# Patient Record
Sex: Female | Born: 1955 | Race: White | Hispanic: No | Marital: Married | Smoking: Former smoker
Health system: Southern US, Community
[De-identification: ages and names within clinical notes are randomized; demographics above are authoritative.]

## PROBLEM LIST (undated history)

## (undated) DIAGNOSIS — E039 Hypothyroidism, unspecified: Secondary | ICD-10-CM

## (undated) DIAGNOSIS — R112 Nausea with vomiting, unspecified: Secondary | ICD-10-CM

## (undated) DIAGNOSIS — G43909 Migraine, unspecified, not intractable, without status migrainosus: Secondary | ICD-10-CM

## (undated) DIAGNOSIS — K4021 Bilateral inguinal hernia, without obstruction or gangrene, recurrent: Secondary | ICD-10-CM

## (undated) DIAGNOSIS — K859 Acute pancreatitis without necrosis or infection, unspecified: Secondary | ICD-10-CM

## (undated) DIAGNOSIS — K219 Gastro-esophageal reflux disease without esophagitis: Secondary | ICD-10-CM

## (undated) DIAGNOSIS — E079 Disorder of thyroid, unspecified: Secondary | ICD-10-CM

## (undated) DIAGNOSIS — Z9889 Other specified postprocedural states: Secondary | ICD-10-CM

## (undated) DIAGNOSIS — E785 Hyperlipidemia, unspecified: Secondary | ICD-10-CM

## (undated) HISTORY — DX: Hyperlipidemia, unspecified: E78.5

## (undated) HISTORY — PX: BREAST BIOPSY: SHX20

---

## 1979-07-19 HISTORY — PX: TUBAL LIGATION: SHX77

## 1997-11-17 HISTORY — PX: HERNIA REPAIR: SHX51

## 2004-11-17 HISTORY — PX: BREAST SURGERY: SHX581

## 2005-11-17 HISTORY — PX: CHOLECYSTECTOMY: SHX55

## 2010-01-30 ENCOUNTER — Other Ambulatory Visit: Admission: RE | Admit: 2010-01-30 | Discharge: 2010-01-30 | Payer: Self-pay | Admitting: Family Medicine

## 2010-06-26 ENCOUNTER — Encounter: Admission: RE | Admit: 2010-06-26 | Discharge: 2010-06-26 | Payer: Self-pay | Admitting: Family Medicine

## 2012-02-26 ENCOUNTER — Other Ambulatory Visit: Payer: Self-pay | Admitting: Family Medicine

## 2012-02-26 DIAGNOSIS — Z1231 Encounter for screening mammogram for malignant neoplasm of breast: Secondary | ICD-10-CM

## 2012-03-04 ENCOUNTER — Ambulatory Visit
Admission: RE | Admit: 2012-03-04 | Discharge: 2012-03-04 | Disposition: A | Discharge status: Home / Self Care | Payer: Commercial Indemnity | Source: Ambulatory Visit | Attending: Family Medicine | Admitting: Family Medicine

## 2012-03-04 DIAGNOSIS — Z1231 Encounter for screening mammogram for malignant neoplasm of breast: Secondary | ICD-10-CM

## 2012-08-05 ENCOUNTER — Ambulatory Visit: Payer: Commercial Indemnity | Admitting: Family Medicine

## 2013-07-26 ENCOUNTER — Other Ambulatory Visit: Payer: Self-pay

## 2013-07-26 DIAGNOSIS — Z1231 Encounter for screening mammogram for malignant neoplasm of breast: Secondary | ICD-10-CM

## 2013-07-29 ENCOUNTER — Ambulatory Visit
Admission: RE | Admit: 2013-07-29 | Discharge: 2013-07-29 | Disposition: A | Discharge status: Home / Self Care | Payer: Commercial Indemnity | Source: Ambulatory Visit

## 2013-07-29 DIAGNOSIS — Z1231 Encounter for screening mammogram for malignant neoplasm of breast: Secondary | ICD-10-CM

## 2013-09-14 ENCOUNTER — Encounter (HOSPITAL_COMMUNITY): Payer: Self-pay | Admitting: Emergency Medicine

## 2013-09-14 ENCOUNTER — Inpatient Hospital Stay (HOSPITAL_COMMUNITY)
Admission: EM | Admit: 2013-09-14 | Discharge: 2013-09-18 | DRG: 440 | Disposition: A | Discharge status: Home / Self Care | Payer: Commercial Indemnity | Attending: Internal Medicine | Admitting: Internal Medicine

## 2013-09-14 ENCOUNTER — Emergency Department (HOSPITAL_COMMUNITY): Payer: Commercial Indemnity

## 2013-09-14 DIAGNOSIS — K8050 Calculus of bile duct without cholangitis or cholecystitis without obstruction: Secondary | ICD-10-CM

## 2013-09-14 DIAGNOSIS — E039 Hypothyroidism, unspecified: Secondary | ICD-10-CM | POA: Diagnosis present

## 2013-09-14 DIAGNOSIS — K859 Acute pancreatitis without necrosis or infection, unspecified: Principal | ICD-10-CM

## 2013-09-14 HISTORY — DX: Migraine, unspecified, not intractable, without status migrainosus: G43.909

## 2013-09-14 HISTORY — DX: Acute pancreatitis without necrosis or infection, unspecified: K85.90

## 2013-09-14 HISTORY — DX: Disorder of thyroid, unspecified: E07.9

## 2013-09-14 LAB — COMPREHENSIVE METABOLIC PANEL
ALT: 12 U/L (ref 0–35)
Albumin: 4.1 g/dL (ref 3.5–5.2)
BUN: 15 mg/dL (ref 6–23)
CO2: 22 mEq/L (ref 19–32)
GFR calc Af Amer: >90 mL/min (ref >90)
Glucose, Bld: 171 mg/dL — ABNORMAL HIGH (ref 70–99)
Potassium: 4.7 mEq/L (ref 3.5–5.1)
Sodium: 138 mEq/L (ref 135–145)

## 2013-09-14 LAB — URINALYSIS, ROUTINE W REFLEX MICROSCOPIC
Hgb urine dipstick: NEGATIVE
Ketones, ur: >80 mg/dL — AB
Nitrite: NEGATIVE
Protein, ur: NEGATIVE mg/dL
Specific Gravity, Urine: 1.031 — ABNORMAL HIGH (ref 1.005–1.030)
Urobilinogen, UA: 0.2 mg/dL (ref 0.0–1.0)
pH: 5 (ref 5.0–8.0)

## 2013-09-14 LAB — CBC WITH DIFFERENTIAL/PLATELET
Basophils Absolute: 0 10*3/uL (ref 0.0–0.1)
Eosinophils Absolute: 0 10*3/uL (ref 0.0–0.7)
Eosinophils Relative: 0 % (ref 0–5)
Lymphs Abs: 1 10*3/uL (ref 0.7–4.0)
MCHC: 33.9 g/dL (ref 30.0–36.0)
Monocytes Relative: 4 % (ref 3–12)
RBC: 5.15 MIL/uL — ABNORMAL HIGH (ref 3.87–5.11)
RDW: 12.7 % (ref 11.5–15.5)

## 2013-09-14 MED ORDER — ONDANSETRON HCL 4 MG/2ML IJ SOLN
4.0000 mg | Freq: Once | INTRAMUSCULAR | Status: DC
  Filled 2013-09-14: qty 2

## 2013-09-14 MED ORDER — IOHEXOL 300 MG/ML  SOLN
100.0000 mL | Freq: Once | INTRAMUSCULAR | Status: AC | PRN
  Administered 2013-09-14: 100 mL via INTRAVENOUS

## 2013-09-14 MED ORDER — ONDANSETRON 4 MG PO TBDP
8.0000 mg | ORAL_TABLET | Freq: Once | ORAL | Status: AC
  Administered 2013-09-14: 8 mg via ORAL

## 2013-09-14 MED ORDER — MORPHINE SULFATE 4 MG/ML IJ SOLN
4.0000 mg | Freq: Once | INTRAMUSCULAR | Status: AC
  Administered 2013-09-14: 4 mg via INTRAVENOUS
  Filled 2013-09-14: qty 1

## 2013-09-14 MED ORDER — SODIUM CHLORIDE 0.9 % IV BOLUS (SEPSIS): 1000.0000 mL | Freq: Once | INTRAVENOUS | Status: DC

## 2013-09-14 MED ORDER — SODIUM CHLORIDE 0.9 % IV SOLN
INTRAVENOUS | Status: DC
  Administered 2013-09-15: 01:00:00 via INTRAVENOUS

## 2013-09-14 MED ORDER — SODIUM CHLORIDE 0.9 % IV BOLUS (SEPSIS)
1000.0000 mL | Freq: Once | INTRAVENOUS | Status: AC
  Administered 2013-09-14: 1000 mL via INTRAVENOUS

## 2013-09-14 MED ORDER — SODIUM CHLORIDE 0.9 % IV SOLN: 1000.0000 mL | Freq: Once | INTRAVENOUS | Status: DC

## 2013-09-14 MED ORDER — ONDANSETRON 4 MG PO TBDP
ORAL_TABLET | ORAL | Status: AC
  Filled 2013-09-14: qty 2

## 2013-09-14 MED ORDER — ONDANSETRON HCL 4 MG/2ML IJ SOLN
4.0000 mg | Freq: Once | INTRAMUSCULAR | Status: AC
  Administered 2013-09-14: 4 mg via INTRAVENOUS

## 2013-09-14 MED ORDER — SODIUM CHLORIDE 0.9 % IV SOLN: 1000.0000 mL | INTRAVENOUS | Status: DC

## 2013-09-14 MED ORDER — IOHEXOL 300 MG/ML  SOLN
25.0000 mL | INTRAMUSCULAR | Status: AC
  Administered 2013-09-14: 25 mL via ORAL

## 2013-09-14 MED ORDER — MORPHINE SULFATE 4 MG/ML IJ SOLN: 4.0000 mg | Freq: Once | INTRAMUSCULAR | Status: DC

## 2013-09-14 NOTE — ED Notes (Signed)
Pt c/o mid sternal abd pain into chest that feels same as when had pancreatitis in past; pt vomiting at present

## 2013-09-14 NOTE — ED Provider Notes (Signed)
CSN: [id number]     Arrival date & time 09/14/13  1659 History   First MD Initiated Contact with Patient 09/14/13 2050     Chief Complaint  Patient presents with  . Abdominal Pain  . Emesis   (Consider location/radiation/quality/duration/timing/severity/associated sxs/prior Treatment) HPI Comments: 57 year old female presents with epigastric abdominal pain since this morning. She states the pain felt like a "stomach bug" that progressively got worse. It feels like the time she had pancreatitis last year. She states that there is a "stone that passed" that was determined to be the cause of her pancreatitis. She states this all happened of bursitis. She's had her gallbladder out but that was several years ago. She denies alcohol intake except for minimal non-daily intake. She denies any fevers or chills. She started vomiting while she was in triage.  Patient is a 57 y.o. female presenting with vomiting.  Emesis Associated symptoms: abdominal pain   Associated symptoms: no diarrhea     Past Medical History  Diagnosis Date  . Pancreatitis   . Thyroid disease   . Migraine    Past Surgical History  Procedure Laterality Date  . Cholecystectomy     History reviewed. No pertinent family history. History  Substance Use Topics  . Smoking status: Never Smoker   . Smokeless tobacco: Not on file  . Alcohol Use: Yes     Comment: occ   OB History   Grav Para Term Preterm Abortions TAB SAB Ect Mult Living                 Review of Systems  Constitutional: Negative for fever.  Cardiovascular: Positive for chest pain.  Gastrointestinal: Positive for nausea, vomiting and abdominal pain. Negative for diarrhea and constipation.  All other systems reviewed and are negative.    Allergies  Gabapentin  Home Medications   Current Outpatient Rx  Name  Route  Sig  Dispense  Refill  . HYDROcodone-acetaminophen (VICODIN) 5-500 MG per tablet   Oral   Take 1 tablet by mouth every 6 (six)  hours as needed for pain (PAIN).         Marland Kitchen levothyroxine (SYNTHROID, LEVOTHROID) 100 MCG tablet   Oral   Take 100 mcg by mouth daily before breakfast.          BP 136/80  Pulse 73  Temp(Src) 97.6 F (36.4 C) (Oral)  Resp 18  SpO2 100% Physical Exam  Nursing note and vitals reviewed. Constitutional: She is oriented to person, place, and time. She appears well-developed and well-nourished. No distress.  HENT:  Head: Normocephalic and atraumatic.  Right Ear: External ear normal.  Left Ear: External ear normal.  Nose: Nose normal.  Eyes: Right eye exhibits no discharge. Left eye exhibits no discharge.  Cardiovascular: Normal rate, regular rhythm and normal heart sounds.   Pulmonary/Chest: Effort normal and breath sounds normal.  Abdominal: Soft. There is tenderness in the right upper quadrant and epigastric area.  Neurological: She is alert and oriented to person, place, and time.  Skin: Skin is warm and dry.    ED Course  Procedures (including critical care time) Labs Review Labs Reviewed  CBC WITH DIFFERENTIAL - Abnormal; Notable for the following:    RBC 5.15 (*)    Neutrophils Relative % 85 (*)    Neutro Abs 8.3 (*)    Lymphocytes Relative 11 (*)    All other components within normal limits  COMPREHENSIVE METABOLIC PANEL - Abnormal; Notable for the following:  Glucose, Bld 171 (*)    All other components within normal limits  LIPASE, BLOOD - Abnormal; Notable for the following:    Lipase 2831 (*)    All other components within normal limits  URINALYSIS, ROUTINE W REFLEX MICROSCOPIC - Abnormal; Notable for the following:    Color, Urine AMBER (*)    Specific Gravity, Urine 1.031 (*)    Bilirubin Urine SMALL (*)    Ketones, ur >80 (*)    All other components within normal limits   Imaging Review Ct Abdomen Pelvis W Contrast  09/14/2013   CLINICAL DATA:  Abdominal pain and emesis. Pancreatitis  EXAM: CT ABDOMEN AND PELVIS WITH CONTRAST  TECHNIQUE:  Multidetector CT imaging of the abdomen and pelvis was performed using the standard protocol following bolus administration of intravenous contrast.  CONTRAST:  OMNIPAQUE IOHEXOL 300 MG/ML  SOLN  COMPARISON:  None.  FINDINGS: BODY WALL: Unremarkable.  LOWER CHEST: Moderate sliding-type hiatal hernia with retained oral contrast.  ABDOMEN/PELVIS:  Liver: No focal abnormality.  Biliary: Cholecystectomy. There is a 4 mm stone/calcification in the region of the confluence of the main pancreatic duct and CBD. The common bile duct is not particularly dilated, measuring at most 7 mm near the hilum.  Pancreas: Pancreatic enlargement and low-attenuation with extensive peripancreatic fluid. No formed collection or evidence of necrosis. No vascular complications seen.  Spleen: Unremarkable.  Adrenals: Unremarkable.  Kidneys and ureters: 4 cm interpolar cyst on the left. No stone or hydronephrosis.  Bladder: Unremarkable.  Reproductive: Heterogeneously enhancing uterus with multiple surface lobulations, consistent with fibroids. 1 of the largest fibroids is in the region of the left cornua, measuring 3 cm in diameter.  Bowel: Reactive thickening of the duodenum. Normal appendix.  Retroperitoneum: Pancreatitis edema as above.  Peritoneum: Small volume free fluid in the pelvis.  Vascular: No acute abnormality.  OSSEOUS: No acute abnormalities.  IMPRESSION: 1. Acute interstitial pancreatitis without necrosis or organized collection. Suspect choledocholithiasis with a 4mm stone just proximal to the major papilla. 2. Cholecystectomy. 3. Moderate sliding hiatal hernia. 4. Fibroid uterus.   Electronically Signed   By: Tiburcio Pea M.D.   On: 09/14/2013 23:23    EKG Interpretation     Ventricular Rate:  109 PR Interval:  170 QRS Duration: 78 QT Interval:  354 QTC Calculation: 476 R Axis:   26 Text Interpretation:  Sinus tachycardia Low voltage QRS Cannot rule out Anterior infarct , age undetermined Abnormal ECG No  old tracing to compare            MDM   1. Pancreatitis   2. Choledocholithiasis    Patient is HDS in ED. Source appears to be choledocholithiasis. Will have hospitalist admit with inpatient GI consult. Will keep NPO and fluid hydrate and control pain with morphine.    Audree Camel, MD 09/14/13 [phone]

## 2013-09-14 NOTE — ED Notes (Signed)
Patient transported to CT 

## 2013-09-14 NOTE — ED Notes (Signed)
Vital signs stable. 

## 2013-09-14 NOTE — ED Notes (Signed)
Pt back from CT, monitor reapplied.

## 2013-09-14 NOTE — ED Notes (Signed)
Denies nausea  

## 2013-09-14 NOTE — ED Notes (Signed)
Pt c/o abd pain with hx of pancreatitis, according to her the pain is feels the same as when she had pancreatitis in the past.  Pain began today, and she has also had nausea since 1630 , and threw up 3 times.  She denies any blood in her vomit or any coffee ground looking material.  denies diarrhea, sob, chest pain, dizziness, changes in vision.

## 2013-09-15 ENCOUNTER — Encounter (HOSPITAL_COMMUNITY): Payer: Self-pay | Admitting: Internal Medicine

## 2013-09-15 DIAGNOSIS — E039 Hypothyroidism, unspecified: Secondary | ICD-10-CM | POA: Diagnosis present

## 2013-09-15 DIAGNOSIS — K859 Acute pancreatitis without necrosis or infection, unspecified: Principal | ICD-10-CM

## 2013-09-15 DIAGNOSIS — K8050 Calculus of bile duct without cholangitis or cholecystitis without obstruction: Secondary | ICD-10-CM

## 2013-09-15 LAB — BASIC METABOLIC PANEL
CO2: 24 mEq/L (ref 19–32)
Calcium: 8.9 mg/dL (ref 8.4–10.5)
Creatinine, Ser: 0.67 mg/dL (ref 0.50–1.10)
GFR calc non Af Amer: >90 mL/min (ref >90)
Glucose, Bld: 130 mg/dL — ABNORMAL HIGH (ref 70–99)
Potassium: 4.4 mEq/L (ref 3.5–5.1)
Sodium: 137 mEq/L (ref 135–145)

## 2013-09-15 LAB — CBC
HCT: 40.3 % (ref 36.0–46.0)
MCH: 28.5 pg (ref 26.0–34.0)
MCV: 84.5 fL (ref 78.0–100.0)
Platelets: 276 10*3/uL (ref 150–400)
RBC: 4.77 MIL/uL (ref 3.87–5.11)
RDW: 12.8 % (ref 11.5–15.5)

## 2013-09-15 LAB — LIPASE, BLOOD: Lipase: 2312 U/L — ABNORMAL HIGH (ref 11–59)

## 2013-09-15 LAB — HEPATIC FUNCTION PANEL
AST: 15 U/L (ref 0–37)
Bilirubin, Direct: 0.1 mg/dL (ref 0.0–0.3)
Total Bilirubin: 0.6 mg/dL (ref 0.3–1.2)

## 2013-09-15 LAB — GLUCOSE, CAPILLARY
Glucose-Capillary: 101 mg/dL — ABNORMAL HIGH (ref 70–99)
Glucose-Capillary: 116 mg/dL — ABNORMAL HIGH (ref 70–99)
Glucose-Capillary: 44 mg/dL — CL (ref 70–99)
Glucose-Capillary: 87 mg/dL (ref 70–99)

## 2013-09-15 MED ORDER — MORPHINE SULFATE 2 MG/ML IJ SOLN
2.0000 mg | INTRAMUSCULAR | Status: AC
  Administered 2013-09-15: 05:00:00 2 mg via INTRAVENOUS
  Filled 2013-09-15: qty 1

## 2013-09-15 MED ORDER — ONDANSETRON HCL 4 MG PO TABS: 4.0000 mg | ORAL_TABLET | Freq: Four times a day (QID) | ORAL | Status: DC | PRN

## 2013-09-15 MED ORDER — SODIUM CHLORIDE 0.9 % IV SOLN
INTRAVENOUS | Status: DC
  Administered 2013-09-15: 500 mL via INTRAVENOUS
  Administered 2013-09-15 – 2013-09-16 (×6): via INTRAVENOUS

## 2013-09-15 MED ORDER — CHLORHEXIDINE GLUCONATE 0.12 % MT SOLN
15.0000 mL | Freq: Two times a day (BID) | OROMUCOSAL | Status: DC
  Administered 2013-09-15 – 2013-09-18 (×7): 15 mL via OROMUCOSAL
  Filled 2013-09-15 (×9): qty 15

## 2013-09-15 MED ORDER — MORPHINE SULFATE 2 MG/ML IJ SOLN
2.0000 mg | INTRAMUSCULAR | Status: DC | PRN
  Administered 2013-09-15 (×4): 2 mg via INTRAVENOUS
  Filled 2013-09-15 (×4): qty 1

## 2013-09-15 MED ORDER — ONDANSETRON HCL 4 MG/2ML IJ SOLN
4.0000 mg | Freq: Four times a day (QID) | INTRAMUSCULAR | Status: DC | PRN
  Administered 2013-09-15 – 2013-09-16 (×4): 4 mg via INTRAVENOUS
  Filled 2013-09-15 (×4): qty 2

## 2013-09-15 MED ORDER — SODIUM CHLORIDE 0.9 % IV SOLN: INTRAVENOUS | Status: DC

## 2013-09-15 MED ORDER — DEXTROSE 50 % IV SOLN
INTRAVENOUS | Status: AC
  Filled 2013-09-15: qty 50

## 2013-09-15 MED ORDER — LEVOTHYROXINE SODIUM 100 MCG IV SOLR
50.0000 ug | Freq: Every day | INTRAVENOUS | Status: DC
  Administered 2013-09-15 – 2013-09-18 (×4): 50 ug via INTRAVENOUS
  Filled 2013-09-15 (×6): qty 5

## 2013-09-15 MED ORDER — ACETAMINOPHEN 325 MG PO TABS: 650.0000 mg | ORAL_TABLET | Freq: Four times a day (QID) | ORAL | Status: DC | PRN

## 2013-09-15 MED ORDER — ACETAMINOPHEN 650 MG RE SUPP: 650.0000 mg | Freq: Four times a day (QID) | RECTAL | Status: DC | PRN

## 2013-09-15 MED ORDER — BIOTENE DRY MOUTH MT LIQD
15.0000 mL | Freq: Two times a day (BID) | OROMUCOSAL | Status: DC
  Administered 2013-09-15 – 2013-09-18 (×4): 15 mL via OROMUCOSAL

## 2013-09-15 MED ORDER — MORPHINE SULFATE 2 MG/ML IJ SOLN
1.0000 mg | INTRAMUSCULAR | Status: DC | PRN
  Administered 2013-09-15: 1 mg via INTRAVENOUS
  Filled 2013-09-15: qty 1

## 2013-09-15 MED ORDER — PROMETHAZINE HCL 25 MG/ML IJ SOLN
12.5000 mg | Freq: Once | INTRAMUSCULAR | Status: AC
  Administered 2013-09-15: 05:00:00 12.5 mg via INTRAVENOUS
  Filled 2013-09-15: qty 1

## 2013-09-15 NOTE — H&P (Signed)
[facility] History and Physical  Maree Ainley VHQ:[id number] DOB: November 13, 1956 DOA: 09/14/2013  Referring physician: ER physician. PCP: Lupita Raider, MD  Specialists: Dr. Dulce Sellar. Gastroenterologist.  Chief Complaint: Abdominal pain.  HPI: Julie Cross is a 57 y.o. female history of hypothyroidism and previous history of pancreatitis last year presents to the year because of persistent abdominal pain since yesterday morning. The pain is epigastric in a.m. nonradiating sharp. In the ER patient had at least 3 episodes of nausea and vomiting. Denies any diarrhea. CT abdomen and pelvis shows features consistent with pancreatitis with choledocholithiasis. Patient states that when she had pancreatitis last is she was told that she has had passed a stone. She had followed up with Dr.Outlaw gastroenterologist and at the time plan was to have further studies if patient had recurrent pancreatitis. Patient otherwise denies any chest pain or shortness of breath.   Review of Systems: As presented in the history of presenting illness, rest negative.  Past Medical History  Diagnosis Date  . Pancreatitis   . Thyroid disease   . Migraine    Past Surgical History  Procedure Laterality Date  . Cholecystectomy    . Hernia repair     Social History:  reports that she has never smoked. She does not have any smokeless tobacco history on file. She reports that she drinks alcohol. She reports that she does not use illicit drugs. Where does patient live  home. Can patient participate in ADLs? YES.   Allergies  Allergen Reactions  . Gabapentin Other (See Comments)    SI    Family History:  Family History  Problem Relation Age of Onset  . Breast cancer Mother   . Lung cancer Father   . Hodgkin's lymphoma Brother   . Stroke Other   . CAD Other       Prior to Admission medications   Medication Sig Start Date End Date Taking? Authorizing Provider  HYDROcodone-acetaminophen (VICODIN) 5-500  MG per tablet Take 1 tablet by mouth every 6 (six) hours as needed for pain (PAIN).   Yes Historical Provider, MD  levothyroxine (SYNTHROID, LEVOTHROID) 100 MCG tablet Take 100 mcg by mouth daily before breakfast.   Yes Historical Provider, MD    Physical Exam: Filed Vitals:   09/14/13 2315 09/14/13 2330 09/14/13 2345 09/15/13 0000  BP: 140/74 129/65 116/63 123/76  Pulse: 87 93 89 100  Temp:      TempSrc:      Resp: 14 21 18    SpO2: 97% 98% 98% 99%     General:   well-developed and nourished.  Eyes:  anicteric no pallor.   ENT:  no discharge from ears eyes nose mouth.   Neck: No mass felt.   Cardiovascular:  S1-S2 heard.   Respiratory: No rhonchi or crepitations.   Abdomen:  soft mild epigastric tenderness no guarding no discolorations no rigidity.   Skin: No rash.   Musculoskeletal:  no edema.   Psychiatric:  appears normal.   Neurologic: Alert awake oriented to time place and person. Moves all extremities.   Labs on Admission:  Basic Metabolic Panel:  Recent Labs Lab 09/14/13 1717  NA 138  K 4.7  CL 100  CO2 22  GLUCOSE 171*  BUN 15  CREATININE 0.72  CALCIUM 9.8   Liver Function Tests:  Recent Labs Lab 09/14/13 1717  AST 16  ALT 12  ALKPHOS 60  BILITOT 0.6  PROT 7.5  ALBUMIN 4.1    Recent Labs Lab 09/14/13 1717  LIPASE  2831*   No results found for this basename: AMMONIA,  in the last 168 hours CBC:  Recent Labs Lab 09/14/13 1717  WBC 9.8  NEUTROABS 8.3*  HGB 14.7  HCT 43.3  MCV 84.1  PLT 273   Cardiac Enzymes: No results found for this basename: CKTOTAL, CKMB, CKMBINDEX, TROPONINI,  in the last 168 hours  BNP (last 3 results) No results found for this basename: PROBNP,  in the last 8760 hours CBG: No results found for this basename: GLUCAP,  in the last 168 hours  Radiological Exams on Admission: Ct Abdomen Pelvis W Contrast  09/14/2013   CLINICAL DATA:  Abdominal pain and emesis. Pancreatitis  EXAM: CT ABDOMEN AND  PELVIS WITH CONTRAST  TECHNIQUE: Multidetector CT imaging of the abdomen and pelvis was performed using the standard protocol following bolus administration of intravenous contrast.  CONTRAST:  OMNIPAQUE IOHEXOL 300 MG/ML  SOLN  COMPARISON:  None.  FINDINGS: BODY WALL: Unremarkable.  LOWER CHEST: Moderate sliding-type hiatal hernia with retained oral contrast.  ABDOMEN/PELVIS:  Liver: No focal abnormality.  Biliary: Cholecystectomy. There is a 4 mm stone/calcification in the region of the confluence of the main pancreatic duct and CBD. The common bile duct is not particularly dilated, measuring at most 7 mm near the hilum.  Pancreas: Pancreatic enlargement and low-attenuation with extensive peripancreatic fluid. No formed collection or evidence of necrosis. No vascular complications seen.  Spleen: Unremarkable.  Adrenals: Unremarkable.  Kidneys and ureters: 4 cm interpolar cyst on the left. No stone or hydronephrosis.  Bladder: Unremarkable.  Reproductive: Heterogeneously enhancing uterus with multiple surface lobulations, consistent with fibroids. 1 of the largest fibroids is in the region of the left cornua, measuring 3 cm in diameter.  Bowel: Reactive thickening of the duodenum. Normal appendix.  Retroperitoneum: Pancreatitis edema as above.  Peritoneum: Small volume free fluid in the pelvis.  Vascular: No acute abnormality.  OSSEOUS: No acute abnormalities.  IMPRESSION: 1. Acute interstitial pancreatitis without necrosis or organized collection. Suspect choledocholithiasis with a 4mm stone just proximal to the major papilla. 2. Cholecystectomy. 3. Moderate sliding hiatal hernia. 4. Fibroid uterus.   Electronically Signed   By: Tiburcio Pea M.D.   On: 09/14/2013 23:23    EKG: Independently reviewed.  sinus tachycardia.   Assessment/Plan Principal Problem:   Pancreatitis Active Problems:   Choledocholithiasis   Hypothyroidism   1. Acute pancreatitis with choledocholithiasis  - at this time we  will keep patient n.p.o. repeat labs and gentle hydration with pain relief medications. Consult Dr. outline a.m. for further workup. Check lipid panel.  2. History of hypothyroidism - at this time I have placed patient on IV Synthroid until patient can take orally.    Code Status:  full code.  Family Communication: None.  Disposition Plan:  admitted inpatient.    Kristiann Noyce N. [facility] Pager [phone].   If 7PM-7AM, please contact night-coverage [email or web address] Password [facility] 09/15/2013, 12:33 AM

## 2013-09-15 NOTE — Progress Notes (Signed)
Pt arrived to floor on stretcher from ED. Paged admissions MD. Awaiting further orders.

## 2013-09-15 NOTE — Progress Notes (Signed)
[facility] PROGRESS NOTE  Julie Cross ZOX:[id number] DOB: 1956/07/27 DOA: 09/14/2013 PCP: Lupita Raider, MD  Subjective: She has pain and nausea, but controlled with medications.  Assessment/Plan:  1. Acute pancreatitis with choledocholithiasis  -Pancreatitis secondary to choledocholithiasis s/p ccy - Lipase elevated - CT shows pancreatic enlargement with no evidence of necrosis; 4mm stone at confluence of main pancreatic duct and CBD. -Pt npo awaiting ERCP scheduled for tomorrow, 10/31 -Pt receiving IV fluids, nausea meds, and pain meds PRN.   2. History of hypothyroidism -Pt receiving IV levothyroxine.   Code Status: full Family Communication: none Disposition Plan: inpatient   Consultants:  GI  Procedures:  none  Antibiotics:  none  HPI/Subjective: Julie Cross is a 57 y.o. female history of hypothyroidism and previous history of pancreatitis last year presents to the year because of persistent abdominal pain since yesterday morning. The pain is epigastric in a.m. nonradiating sharp. In the ER patient had at least 3 episodes of nausea and vomiting. Denies any diarrhea. CT abdomen and pelvis shows features consistent with pancreatitis with choledocholithiasis. Patient states that when she had pancreatitis last is she was told that she has had passed a stone. She had followed up with Dr.Outlaw gastroenterologist and at the time plan was to have further studies if patient had recurrent pancreatitis. Patient otherwise denies any chest pain or shortness of breath.  As of 10/30 patient is in significant discomfort and has some nausea for which she has received pain medication and anti-emetics.  She has an ERCP scheduled for tomorrow to remove the occlusive stone.   Objective: Filed Vitals:   09/15/13 0507  BP: 147/61  Pulse: 102  Temp: 98.9 F (37.2 C)  Resp: 20    Intake/Output Summary (Last 24 hours) at 09/15/13 1043 Last data filed at 09/15/13 0600   Gross per 24 hour  Intake    500 ml  Output      0 ml  Net    500 ml   Filed Weights   09/15/13 0036  Weight: 85.5 kg (188 lb 7.9 oz)    Exam:   General:  wdwn female in distress lying in bed  Cardiovascular: normal s1 s2, reg rhythm, slightly tachycardic, no m/g/r  Respiratory: clear to auscultation bilat, no inc wob  Abdomen: hypoactive bowel sounds, tender to palpation especially in epigastric area, nondistended, no guarding  Musculoskeletal: intact x4, no edema  Data Reviewed: Basic Metabolic Panel:  Recent Labs Lab 09/14/13 1717 09/15/13 0443  NA 138 137  K 4.7 4.4  CL 100 103  CO2 22 24  GLUCOSE 171* 130*  BUN 15 11  CREATININE 0.72 0.67  CALCIUM 9.8 8.9   Liver Function Tests:  Recent Labs Lab 09/14/13 1717 09/15/13 0443  AST 16 15  ALT 12 10  ALKPHOS 60 55  BILITOT 0.6 0.6  PROT 7.5 7.0  ALBUMIN 4.1 3.7    Recent Labs Lab 09/14/13 1717 09/15/13 0443  LIPASE 2831* 2312*   No results found for this basename: AMMONIA,  in the last 168 hours CBC:  Recent Labs Lab 09/14/13 1717 09/15/13 0443  WBC 9.8 11.0*  NEUTROABS 8.3*  --   HGB 14.7 13.6  HCT 43.3 40.3  MCV 84.1 84.5  PLT 273 276   CBG:  Recent Labs Lab 09/15/13 0632 09/15/13 0640  GLUCAP 44* 116*      Studies: Ct Abdomen Pelvis W Contrast  09/14/2013   CLINICAL DATA:  Abdominal pain and emesis. Pancreatitis  EXAM: CT ABDOMEN AND  PELVIS WITH CONTRAST  TECHNIQUE: Multidetector CT imaging of the abdomen and pelvis was performed using the standard protocol following bolus administration of intravenous contrast.  CONTRAST:  OMNIPAQUE IOHEXOL 300 MG/ML  SOLN  COMPARISON:  None.  FINDINGS: BODY WALL: Unremarkable.  LOWER CHEST: Moderate sliding-type hiatal hernia with retained oral contrast.  ABDOMEN/PELVIS:  Liver: No focal abnormality.  Biliary: Cholecystectomy. There is a 4 mm stone/calcification in the region of the confluence of the main pancreatic duct and CBD. The  common bile duct is not particularly dilated, measuring at most 7 mm near the hilum.  Pancreas: Pancreatic enlargement and low-attenuation with extensive peripancreatic fluid. No formed collection or evidence of necrosis. No vascular complications seen.  Spleen: Unremarkable.  Adrenals: Unremarkable.  Kidneys and ureters: 4 cm interpolar cyst on the left. No stone or hydronephrosis.  Bladder: Unremarkable.  Reproductive: Heterogeneously enhancing uterus with multiple surface lobulations, consistent with fibroids. 1 of the largest fibroids is in the region of the left cornua, measuring 3 cm in diameter.  Bowel: Reactive thickening of the duodenum. Normal appendix.  Retroperitoneum: Pancreatitis edema as above.  Peritoneum: Small volume free fluid in the pelvis.  Vascular: No acute abnormality.  OSSEOUS: No acute abnormalities.  IMPRESSION: 1. Acute interstitial pancreatitis without necrosis or organized collection. Suspect choledocholithiasis with a 4mm stone just proximal to the major papilla. 2. Cholecystectomy. 3. Moderate sliding hiatal hernia. 4. Fibroid uterus.   Electronically Signed   By: Tiburcio Pea M.D.   On: 09/14/2013 23:23    Scheduled Meds: . antiseptic oral rinse  15 mL Mouth Rinse q12n4p  . chlorhexidine  15 mL Mouth Rinse BID  . levothyroxine  50 mcg Intravenous QAC breakfast   Continuous Infusions: . sodium chloride 500 mL (09/15/13 0739)    Principal Problem:   Pancreatitis Active Problems:   Choledocholithiasis   Hypothyroidism       Dorinda Hill, PA-S  [facility] Pager [phone] If 7PM-7AM, please contact night-coverage at [email or web address], password [facility] 09/15/2013, 10:43 AM  LOS: 1 day      Addendum  Patient seen and examined, chart and data base reviewed.  I agree with the above assessment and plan.  For full details please see Mrs. Dorinda Hill, PA-S note.  The above note is reviewed and addended by me.   Clint Lipps, MD [facility] Pager: [phone] 09/15/2013, 12:26 PM

## 2013-09-15 NOTE — Consult Note (Signed)
[facility] Consult Note  Referring Provider: No ref. provider found Primary Care Physician:  Lupita Raider, MD Primary Gastroenterologist:  Dr.  Antony Contras Complaint: Abdominal pain HPI: Julie Cross is an 57 y.o. white female  who presented with epigastric abdominal pain nausea and vomiting radiating to the back. She had serologic and CT evidence of pancreatitis with a calculus seen in the distal common bile duct near the confluence of the pancreatic duct. Her liver function tests were normal. She was seen in the emergency room about a year ago and was told she had pancreatitis and was released. She saw one of my partners in the office but apparently never had any further imaging or diagnostic procedures done. She is currently in moderate pain and has received narcotic pain medicine but seems alert and oriented she admits to drinking alcohol about one or 2 glasses of wine a month.   Past Medical History  Diagnosis Date  . Pancreatitis   . Thyroid disease   . Migraine     Past Surgical History  Procedure Laterality Date  . Cholecystectomy    . Hernia repair      Medications Prior to Admission  Medication Sig Dispense Refill  . HYDROcodone-acetaminophen (VICODIN) 5-500 MG per tablet Take 1 tablet by mouth every 6 (six) hours as needed for pain (PAIN).      Marland Kitchen levothyroxine (SYNTHROID, LEVOTHROID) 100 MCG tablet Take 100 mcg by mouth daily before breakfast.        Allergies:  Allergies  Allergen Reactions  . Gabapentin Other (See Comments)    SI    Family History  Problem Relation Age of Onset  . Breast cancer Mother   . Lung cancer Father   . Hodgkin's lymphoma Brother   . Stroke Other   . CAD Other     Social History:  reports that she has never smoked. She does not have any smokeless tobacco history on file. She reports that she drinks alcohol. She reports that she does not use illicit drugs.  Review of Systems: negative except as above   Blood pressure  147/61, pulse 102, temperature 98.9 F (37.2 C), temperature source Oral, resp. rate 20, height 5\' 2"  (1.575 m), weight 85.5 kg (188 lb 7.9 oz), SpO2 97.00%. Head: Normocephalic, without obvious abnormality, atraumatic Neck: no adenopathy, no carotid bruit, no JVD, supple, symmetrical, trachea midline and thyroid not enlarged, symmetric, no tenderness/mass/nodules Resp: clear to auscultation bilaterally Cardio: regular rate and rhythm, S1, S2 normal, no murmur, click, rub or gallop GI: Abdomen moderately tender diffusely especially in the epigastric and left upper quadrant Extremities: extremities normal, atraumatic, no cyanosis or edema  Results for orders placed during the hospital encounter of 09/14/13 (from the past 48 hour(s))  CBC WITH DIFFERENTIAL     Status: Abnormal   Collection Time    09/14/13  5:17 PM      Result Value Range   WBC 9.8  4.0 - 10.5 K/uL   RBC 5.15 (*) 3.87 - 5.11 MIL/uL   Hemoglobin 14.7  12.0 - 15.0 g/dL   HCT 41.3  24.4 - 01.0 %   MCV 84.1  78.0 - 100.0 fL   MCH 28.5  26.0 - 34.0 pg   MCHC 33.9  30.0 - 36.0 g/dL   RDW 27.2  53.6 - 64.4 %   Platelets 273  150 - 400 K/uL   Neutrophils Relative % 85 (*) 43 - 77 %   Neutro Abs 8.3 (*) 1.7 - 7.7 K/uL  Lymphocytes Relative 11 (*) 12 - 46 %   Lymphs Abs 1.0  0.7 - 4.0 K/uL   Monocytes Relative 4  3 - 12 %   Monocytes Absolute 0.4  0.1 - 1.0 K/uL   Eosinophils Relative 0  0 - 5 %   Eosinophils Absolute 0.0  0.0 - 0.7 K/uL   Basophils Relative 0  0 - 1 %   Basophils Absolute 0.0  0.0 - 0.1 K/uL  COMPREHENSIVE METABOLIC PANEL     Status: Abnormal   Collection Time    09/14/13  5:17 PM      Result Value Range   Sodium 138  135 - 145 mEq/L   Potassium 4.7  3.5 - 5.1 mEq/L   Chloride 100  96 - 112 mEq/L   CO2 22  19 - 32 mEq/L   Glucose, Bld 171 (*) 70 - 99 mg/dL   BUN 15  6 - 23 mg/dL   Creatinine, Ser 1.61  0.50 - 1.10 mg/dL   Calcium 9.8  8.4 - 09.6 mg/dL   Total Protein 7.5  6.0 - 8.3 g/dL   Albumin  4.1  3.5 - 5.2 g/dL   AST 16  0 - 37 U/L   ALT 12  0 - 35 U/L   Alkaline Phosphatase 60  39 - 117 U/L   Total Bilirubin 0.6  0.3 - 1.2 mg/dL   GFR calc non Af Amer >90  >90 mL/min   GFR calc Af Amer >90  >90 mL/min   Comment: (NOTE)     The eGFR has been calculated using the CKD EPI equation.     This calculation has not been validated in all clinical situations.     eGFR's persistently <90 mL/min signify possible Chronic Kidney     Disease.  LIPASE, BLOOD     Status: Abnormal   Collection Time    09/14/13  5:17 PM      Result Value Range   Lipase 2831 (*) 11 - 59 U/L  URINALYSIS, ROUTINE W REFLEX MICROSCOPIC     Status: Abnormal   Collection Time    09/14/13  9:42 PM      Result Value Range   Color, Urine AMBER (*) YELLOW   Comment: BIOCHEMICALS MAY BE AFFECTED BY COLOR   APPearance CLEAR  CLEAR   Specific Gravity, Urine 1.031 (*) 1.005 - 1.030   pH 5.0  5.0 - 8.0   Glucose, UA NEGATIVE  NEGATIVE mg/dL   Hgb urine dipstick NEGATIVE  NEGATIVE   Bilirubin Urine SMALL (*) NEGATIVE   Ketones, ur >80 (*) NEGATIVE mg/dL   Protein, ur NEGATIVE  NEGATIVE mg/dL   Urobilinogen, UA 0.2  0.0 - 1.0 mg/dL   Nitrite NEGATIVE  NEGATIVE   Leukocytes, UA NEGATIVE  NEGATIVE   Comment: MICROSCOPIC NOT DONE ON URINES WITH NEGATIVE PROTEIN, BLOOD, LEUKOCYTES, NITRITE, OR GLUCOSE <1000 mg/dL.  HEPATIC FUNCTION PANEL     Status: None   Collection Time    09/15/13  4:43 AM      Result Value Range   Total Protein 7.0  6.0 - 8.3 g/dL   Albumin 3.7  3.5 - 5.2 g/dL   AST 15  0 - 37 U/L   ALT 10  0 - 35 U/L   Alkaline Phosphatase 55  39 - 117 U/L   Total Bilirubin 0.6  0.3 - 1.2 mg/dL   Bilirubin, Direct 0.1  0.0 - 0.3 mg/dL   Indirect Bilirubin 0.5  0.3 -  0.9 mg/dL  LIPASE, BLOOD     Status: Abnormal   Collection Time    09/15/13  4:43 AM      Result Value Range   Lipase 2312 (*) 11 - 59 U/L  BASIC METABOLIC PANEL     Status: Abnormal   Collection Time    09/15/13  4:43 AM      Result  Value Range   Sodium 137  135 - 145 mEq/L   Potassium 4.4  3.5 - 5.1 mEq/L   Chloride 103  96 - 112 mEq/L   CO2 24  19 - 32 mEq/L   Glucose, Bld 130 (*) 70 - 99 mg/dL   BUN 11  6 - 23 mg/dL   Creatinine, Ser 1.61  0.50 - 1.10 mg/dL   Calcium 8.9  8.4 - 09.6 mg/dL   GFR calc non Af Amer >90  >90 mL/min   GFR calc Af Amer >90  >90 mL/min   Comment: (NOTE)     The eGFR has been calculated using the CKD EPI equation.     This calculation has not been validated in all clinical situations.     eGFR's persistently <90 mL/min signify possible Chronic Kidney     Disease.  CBC     Status: Abnormal   Collection Time    09/15/13  4:43 AM      Result Value Range   WBC 11.0 (*) 4.0 - 10.5 K/uL   RBC 4.77  3.87 - 5.11 MIL/uL   Hemoglobin 13.6  12.0 - 15.0 g/dL   HCT 04.5  40.9 - 81.1 %   MCV 84.5  78.0 - 100.0 fL   MCH 28.5  26.0 - 34.0 pg   MCHC 33.7  30.0 - 36.0 g/dL   RDW 91.4  78.2 - 95.6 %   Platelets 276  150 - 400 K/uL  GLUCOSE, CAPILLARY     Status: Abnormal   Collection Time    09/15/13  6:32 AM      Result Value Range   Glucose-Capillary 44 (*) 70 - 99 mg/dL   Comment 1 Documented in Chart     Comment 2 Notify RN    GLUCOSE, CAPILLARY     Status: Abnormal   Collection Time    09/15/13  6:40 AM      Result Value Range   Glucose-Capillary 116 (*) 70 - 99 mg/dL   Ct Abdomen Pelvis W Contrast  09/14/2013   CLINICAL DATA:  Abdominal pain and emesis. Pancreatitis  EXAM: CT ABDOMEN AND PELVIS WITH CONTRAST  TECHNIQUE: Multidetector CT imaging of the abdomen and pelvis was performed using the standard protocol following bolus administration of intravenous contrast.  CONTRAST:  OMNIPAQUE IOHEXOL 300 MG/ML  SOLN  COMPARISON:  None.  FINDINGS: BODY WALL: Unremarkable.  LOWER CHEST: Moderate sliding-type hiatal hernia with retained oral contrast.  ABDOMEN/PELVIS:  Liver: No focal abnormality.  Biliary: Cholecystectomy. There is a 4 mm stone/calcification in the region of the  confluence of the main pancreatic duct and CBD. The common bile duct is not particularly dilated, measuring at most 7 mm near the hilum.  Pancreas: Pancreatic enlargement and low-attenuation with extensive peripancreatic fluid. No formed collection or evidence of necrosis. No vascular complications seen.  Spleen: Unremarkable.  Adrenals: Unremarkable.  Kidneys and ureters: 4 cm interpolar cyst on the left. No stone or hydronephrosis.  Bladder: Unremarkable.  Reproductive: Heterogeneously enhancing uterus with multiple surface lobulations, consistent with fibroids. 1 of the largest fibroids is  in the region of the left cornua, measuring 3 cm in diameter.  Bowel: Reactive thickening of the duodenum. Normal appendix.  Retroperitoneum: Pancreatitis edema as above.  Peritoneum: Small volume free fluid in the pelvis.  Vascular: No acute abnormality.  OSSEOUS: No acute abnormalities.  IMPRESSION: 1. Acute interstitial pancreatitis without necrosis or organized collection. Suspect choledocholithiasis with a 4mm stone just proximal to the major papilla. 2. Cholecystectomy. 3. Moderate sliding hiatal hernia. 4. Fibroid uterus.   Electronically Signed   By: Tiburcio Pea M.D.   On: 09/14/2013 23:23    Assessment: Gallstone pancreatitis with calculus seen on CT scan. Plan:  After further recovery will proceed with ERCP probably tomorrow. Risks, rationale, and alternatives were explained to the patient she wishes to proceed. Valaree Fresquez C 09/15/2013, 9:59 AM

## 2013-09-15 NOTE — Progress Notes (Signed)
NURSING PROGRESS NOTE  Julie Cross [id number] Admission Data: 09/15/2013 1:11 AM Attending Provider: Eduard Clos, MD YQM:VHQI,[id number], MD Code Status:full code  Julie Cross is a 57 y.o. female patient admitted from ED:  -No acute distress noted.  -No complaints of shortness of breath.  -No complaints of chest pain.   Cardiac Monitoring  Box # N/A in place. Cardiac monitor yields:N/A.  Blood pressure 135/71, pulse 94, temperature 98.6 F (37 C), temperature source Oral, resp. rate 16, height 5\' 2"  (1.575 m), weight 85.5 kg (188 lb 7.9 oz), SpO2 100.00%.   IV Fluids:  IV in place, occlusive dsg intact without redness, IV cath antecubital right, condition patent and no redness normal saline.   Allergies:  Gabapentin  Past Medical History:   has a past medical history of Pancreatitis; Thyroid disease; and Migraine.  Past Surgical History:   has past surgical history that includes Cholecystectomy and Hernia repair.  Social History:   reports that she has never smoked. She does not have any smokeless tobacco history on file. She reports that she drinks alcohol. She reports that she does not use illicit drugs.  Skin: SKIN INTACT   Patient/Family orientated to room. Information packet given to patient/family. Admission inpatient armband information verified with patient/family to include name and date of birth and placed on patient arm. Side rails up x 2, fall assessment and education completed with patient/family. Patient/family able to verbalize understanding of risk associated with falls and verbalized understanding to call for assistance before getting out of bed. Call light within reach. Patient/family able to voice and demonstrate understanding of unit orientation instructions.    Will continue to evaluate and treat per MD orders.

## 2013-09-16 ENCOUNTER — Encounter (HOSPITAL_COMMUNITY)
Admission: EM | Disposition: A | Discharge status: Home / Self Care | Payer: Self-pay | Source: Home / Self Care | Attending: Internal Medicine

## 2013-09-16 ENCOUNTER — Inpatient Hospital Stay (HOSPITAL_COMMUNITY): Payer: Commercial Indemnity

## 2013-09-16 LAB — GLUCOSE, CAPILLARY
Glucose-Capillary: 110 mg/dL — ABNORMAL HIGH (ref 70–99)
Glucose-Capillary: 80 mg/dL (ref 70–99)
Glucose-Capillary: 93 mg/dL (ref 70–99)
Glucose-Capillary: 95 mg/dL (ref 70–99)

## 2013-09-16 LAB — COMPREHENSIVE METABOLIC PANEL
AST: 14 U/L (ref 0–37)
Albumin: 2.9 g/dL — ABNORMAL LOW (ref 3.5–5.2)
BUN: 7 mg/dL (ref 6–23)
CO2: 24 mEq/L (ref 19–32)
Calcium: 8 mg/dL — ABNORMAL LOW (ref 8.4–10.5)
Chloride: 104 mEq/L (ref 96–112)
Creatinine, Ser: 0.58 mg/dL (ref 0.50–1.10)
GFR calc non Af Amer: >90 mL/min (ref >90)
Total Bilirubin: 0.6 mg/dL (ref 0.3–1.2)
Total Protein: 5.9 g/dL — ABNORMAL LOW (ref 6.0–8.3)

## 2013-09-16 LAB — LIPASE, BLOOD: Lipase: 1002 U/L — ABNORMAL HIGH (ref 11–59)

## 2013-09-16 SURGERY — ERCP, WITH INTERVENTION IF INDICATED
Anesthesia: Moderate Sedation

## 2013-09-16 MED ORDER — DIPHENHYDRAMINE HCL 25 MG PO CAPS
25.0000 mg | ORAL_CAPSULE | Freq: Once | ORAL | Status: AC
  Administered 2013-09-16: 25 mg via ORAL
  Filled 2013-09-16: qty 1

## 2013-09-16 MED ORDER — POTASSIUM CHLORIDE 2 MEQ/ML IV SOLN
INTRAVENOUS | Status: DC
  Administered 2013-09-16 – 2013-09-18 (×3): via INTRAVENOUS
  Filled 2013-09-16 (×9): qty 1000

## 2013-09-16 MED ORDER — GADOBENATE DIMEGLUMINE 529 MG/ML IV SOLN
20.0000 mL | Freq: Once | INTRAVENOUS | Status: AC
  Administered 2013-09-16: 18 mL via INTRAVENOUS

## 2013-09-16 MED ORDER — MORPHINE SULFATE 2 MG/ML IJ SOLN
1.0000 mg | INTRAMUSCULAR | Status: DC | PRN
  Administered 2013-09-16: 1 mg via INTRAVENOUS
  Filled 2013-09-16: qty 1

## 2013-09-16 NOTE — Progress Notes (Addendum)
[facility] PROGRESS NOTE  Janvi Ammar ZOX:[id number] DOB: July 06, 1956 DOA: 09/14/2013 PCP: Lupita Raider, MD  Assessment/Plan:  1. Acute pancreatitis with choledocholithiasis  -Pancreatitis secondary to choledocholithiasis s/p ccy  -Lipase elevated, but decreasing as of 10/31 (liapse 1002) -CT shows pancreatic enlargement with no evidence of necrosis; 4mm stone at confluence of main pancreatic duct and CBD.  -MRCP obtained 10/31.  Results not conclusive.  Dr. Madilyn Fireman to review w/ Radiology -Pt on clears, npo after midnight for possible ERCP 11/1. -Pt receiving IV fluids, nausea meds, and pain meds PRN.   2. History of hypothyroidism  -Pt receiving IV levothyroxine.    Code Status: full Family Communication: none Disposition Plan: inpatient   Consultants:  GI  Procedures:  MRCP to be done 10/31  Antibiotics: None  HPI/Subjective: Anaeli Cornwall is a 57 y.o. female history of hypothyroidism and previous history of pancreatitis last year presents to the year because of persistent abdominal pain since yesterday morning. The pain is epigastric in a.m. nonradiating sharp. In the ER patient had at least 3 episodes of nausea and vomiting. Denies any diarrhea. CT abdomen and pelvis shows features consistent with pancreatitis with choledocholithiasis. Patient states that when she had pancreatitis last is she was told that she has had passed a stone. She had followed up with Dr.Outlaw gastroenterologist and at the time plan was to have further studies if patient had recurrent pancreatitis. Patient otherwise denies any chest pain or shortness of breath.   As of 10/31 patient is less pain (rates a 3 of 10) and less nausea.   Objective: Filed Vitals:   09/16/13 0830  BP: 141/81  Pulse: 78  Temp: 99.3 F (37.4 C)  Resp: 20    Intake/Output Summary (Last 24 hours) at 09/16/13 1156 Last data filed at 09/15/13 2200  Gross per 24 hour  Intake 3752.5 ml  Output      0 ml   Net 3752.5 ml   Filed Weights   09/15/13 0036  Weight: 85.5 kg (188 lb 7.9 oz)    Exam:   General:  wdwn female in nad  Cardiovascular: normal s1 s2, RRR, no m/g/r  Respiratory: clear to ausc bilat, no inc wob  Abdomen: +normoactive BS throughout, nondistended, slightly tender but decreased from yesterday  Musculoskeletal: intact ROM x4, no edema  Data Reviewed: Basic Metabolic Panel:  Recent Labs Lab 09/14/13 1717 09/15/13 0443 09/16/13 0525  NA 138 137 138  K 4.7 4.4 3.4*  CL 100 103 104  CO2 22 24 24   GLUCOSE 171* 130* 89  BUN 15 11 7   CREATININE 0.72 0.67 0.58  CALCIUM 9.8 8.9 8.0*   Liver Function Tests:  Recent Labs Lab 09/14/13 1717 09/15/13 0443 09/16/13 0525  AST 16 15 14   ALT 12 10 9   ALKPHOS 60 55 48  BILITOT 0.6 0.6 0.6  PROT 7.5 7.0 5.9*  ALBUMIN 4.1 3.7 2.9*    Recent Labs Lab 09/14/13 1717 09/15/13 0443 09/16/13 0525  LIPASE 2831* 2312* 1002*    CBC:  Recent Labs Lab 09/14/13 1717 09/15/13 0443  WBC 9.8 11.0*  NEUTROABS 8.3*  --   HGB 14.7 13.6  HCT 43.3 40.3  MCV 84.1 84.5  PLT 273 276   CBG:  Recent Labs Lab 09/15/13 0632 09/15/13 0640 09/15/13 1617 09/15/13 2335 09/16/13 0513  GLUCAP 44* 116* 101* 87 93       Studies: Ct Abdomen Pelvis W Contrast  09/14/2013   CLINICAL DATA:  Abdominal pain and emesis. Pancreatitis  EXAM: CT ABDOMEN AND PELVIS WITH CONTRAST  TECHNIQUE: Multidetector CT imaging of the abdomen and pelvis was performed using the standard protocol following bolus administration of intravenous contrast.  CONTRAST:  OMNIPAQUE IOHEXOL 300 MG/ML  SOLN  COMPARISON:  None.  FINDINGS: BODY WALL: Unremarkable.  LOWER CHEST: Moderate sliding-type hiatal hernia with retained oral contrast.  ABDOMEN/PELVIS:  Liver: No focal abnormality.  Biliary: Cholecystectomy. There is a 4 mm stone/calcification in the region of the confluence of the main pancreatic duct and CBD. The common bile duct is not  particularly dilated, measuring at most 7 mm near the hilum.  Pancreas: Pancreatic enlargement and low-attenuation with extensive peripancreatic fluid. No formed collection or evidence of necrosis. No vascular complications seen.  Spleen: Unremarkable.  Adrenals: Unremarkable.  Kidneys and ureters: 4 cm interpolar cyst on the left. No stone or hydronephrosis.  Bladder: Unremarkable.  Reproductive: Heterogeneously enhancing uterus with multiple surface lobulations, consistent with fibroids. 1 of the largest fibroids is in the region of the left cornua, measuring 3 cm in diameter.  Bowel: Reactive thickening of the duodenum. Normal appendix.  Retroperitoneum: Pancreatitis edema as above.  Peritoneum: Small volume free fluid in the pelvis.  Vascular: No acute abnormality.  OSSEOUS: No acute abnormalities.  IMPRESSION: 1. Acute interstitial pancreatitis without necrosis or organized collection. Suspect choledocholithiasis with a 4mm stone just proximal to the major papilla. 2. Cholecystectomy. 3. Moderate sliding hiatal hernia. 4. Fibroid uterus.   Electronically Signed   By: Tiburcio Pea M.D.   On: 09/14/2013 23:23   Mr 3d Recon At Scanner  09/16/2013   CLINICAL DATA:  Previous history of pancreatitis. Persistent abdominal pain. Current acute pancreatitis with potential obstructing calculus seen on comparison CT.  EXAM: MR ABDOMEN WO/W CM MRCP; MR 3D RECON AT SCANNER  TECHNIQUE: Multiplanar multisequence MR imaging of the abdomen was performed, including heavily T2-weighted images of the biliary and pancreatic ducts. Three-dimensional MR images were rendered by post processing of the original MR data.  CONTRAST:  18mL MULTIHANCE GADOBENATE DIMEGLUMINE 529 MG/ML IV SOLN  COMPARISON:  CT 09/14/2013  FINDINGS: There is no intrahepatic biliary duct dilatation. Patient status post cholecystectomy. The common bile duct is upper limits of normal at 6 mm. The pancreatic duct is mildly dilated to 4 mm. The calculus seen  on comparison CT at the level of the confluence of the pancreatic duct and common bile duct is not well appreciated. There are no discrete filling defects within the common bile duct or pancreatic duct. There is a potential signal void at the ampulla which could represent a calculus but si indeterminate. (image 49 series 10 and image 17, series 4.). No variant pancreatic ductal anatomy.  There is evidence of acute pancreatitis with edema of the pancreatic head and body as well as peripancreatic fluid. The pancreatic duct is uninterrupted. There are no organized fluid collections. The pancreatic head and body enhance without evidence of necrosis. Small fluid extends along the anterior perirenal space.  The spleen is normal. Adrenal glands and kidneys are normal. There is a simple cyst within the left kidney.  IMPRESSION: 1. The small calculus at the confluence of the pancreatic duct and common bile duct is betterdemonstrated on comparison CT. Potential signal void at this level is indeterminate.  2. Acute pancreatitis with pancreatic inflammation and peripancreatic fluid. No evidence of organized fluid collections. No evidence of pancreatic necrosis or pancreatic ductal interruption.  3. No evidence of biliary obstruction.  4. No variant pancreatic ductal anatomy.  Electronically Signed   By: Genevive Bi M.D.   On: 09/16/2013 11:10   Mr Abd W/wo Cm/mrcp  09/16/2013   CLINICAL DATA:  Previous history of pancreatitis. Persistent abdominal pain. Current acute pancreatitis with potential obstructing calculus seen on comparison CT.  EXAM: MR ABDOMEN WO/W CM MRCP; MR 3D RECON AT SCANNER  TECHNIQUE: Multiplanar multisequence MR imaging of the abdomen was performed, including heavily T2-weighted images of the biliary and pancreatic ducts. Three-dimensional MR images were rendered by post processing of the original MR data.  CONTRAST:  18mL MULTIHANCE GADOBENATE DIMEGLUMINE 529 MG/ML IV SOLN  COMPARISON:  CT  09/14/2013  FINDINGS: There is no intrahepatic biliary duct dilatation. Patient status post cholecystectomy. The common bile duct is upper limits of normal at 6 mm. The pancreatic duct is mildly dilated to 4 mm. The calculus seen on comparison CT at the level of the confluence of the pancreatic duct and common bile duct is not well appreciated. There are no discrete filling defects within the common bile duct or pancreatic duct. There is a potential signal void at the ampulla which could represent a calculus but si indeterminate. (image 49 series 10 and image 17, series 4.). No variant pancreatic ductal anatomy.  There is evidence of acute pancreatitis with edema of the pancreatic head and body as well as peripancreatic fluid. The pancreatic duct is uninterrupted. There are no organized fluid collections. The pancreatic head and body enhance without evidence of necrosis. Small fluid extends along the anterior perirenal space.  The spleen is normal. Adrenal glands and kidneys are normal. There is a simple cyst within the left kidney.  IMPRESSION: 1. The small calculus at the confluence of the pancreatic duct and common bile duct is betterdemonstrated on comparison CT. Potential signal void at this level is indeterminate.  2. Acute pancreatitis with pancreatic inflammation and peripancreatic fluid. No evidence of organized fluid collections. No evidence of pancreatic necrosis or pancreatic ductal interruption.  3. No evidence of biliary obstruction.  4. No variant pancreatic ductal anatomy.   Electronically Signed   By: Genevive Bi M.D.   On: 09/16/2013 11:10    Scheduled Meds: . antiseptic oral rinse  15 mL Mouth Rinse q12n4p  . chlorhexidine  15 mL Mouth Rinse BID  . levothyroxine  50 mcg Intravenous QAC breakfast   Continuous Infusions: . sodium chloride 0.9 % 1,000 mL with potassium chloride 20 mEq infusion 100 mL/hr at 09/16/13 1610    Principal Problem:   Pancreatitis Active Problems:    Choledocholithiasis   Hypothyroidism     Dorinda Hill, PA-S  Algis Downs, PA-C [facility] Pager [phone]. If 7PM-7AM, please contact night-coverage at [email or web address], password [facility] 09/16/2013, 11:56 AM  LOS: 2 days     Addendum  Patient seen and examined, chart and data base reviewed.  I agree with the above assessment and plan.  For full details please see Mrs. Dorinda Hill, PA-S note, reviewed by Mrs Algis Downs PA and me.  I reviewed and addended the above note as appropriate.   Clint Lipps, MD [facility] Pager: [phone] 09/16/2013, 12:56 PM

## 2013-09-16 NOTE — Progress Notes (Signed)
[facility] Progress Note  Subjective:  Pain a little better today  Objective: Vital signs in last 24 hours: Temp:  [99.3 F (37.4 C)-99.9 F (37.7 C)] 99.9 F (37.7 C) (10/31 1300) Pulse Rate:  [77-86] 77 (10/31 1300) Resp:  [18-20] 20 (10/31 1300) BP: (130-167)/(74-84) 167/74 mmHg (10/31 1300) SpO2:  [94 %-95 %] 95 % (10/31 1300) Weight change:    JW:JXBJ tender  Lab Results: Results for orders placed during the hospital encounter of 09/14/13 (from the past 24 hour(s))  GLUCOSE, CAPILLARY     Status: None   Collection Time    09/15/13 11:35 PM      Result Value Range   Glucose-Capillary 87  70 - 99 mg/dL  GLUCOSE, CAPILLARY     Status: None   Collection Time    09/16/13  5:13 AM      Result Value Range   Glucose-Capillary 93  70 - 99 mg/dL  COMPREHENSIVE METABOLIC PANEL     Status: Abnormal   Collection Time    09/16/13  5:25 AM      Result Value Range   Sodium 138  135 - 145 mEq/L   Potassium 3.4 (*) 3.5 - 5.1 mEq/L   Chloride 104  96 - 112 mEq/L   CO2 24  19 - 32 mEq/L   Glucose, Bld 89  70 - 99 mg/dL   BUN 7  6 - 23 mg/dL   Creatinine, Ser 4.78  0.50 - 1.10 mg/dL   Calcium 8.0 (*) 8.4 - 10.5 mg/dL   Total Protein 5.9 (*) 6.0 - 8.3 g/dL   Albumin 2.9 (*) 3.5 - 5.2 g/dL   AST 14  0 - 37 U/L   ALT 9  0 - 35 U/L   Alkaline Phosphatase 48  39 - 117 U/L   Total Bilirubin 0.6  0.3 - 1.2 mg/dL   GFR calc non Af Amer >90  >90 mL/min   GFR calc Af Amer >90  >90 mL/min  LIPASE, BLOOD     Status: Abnormal   Collection Time    09/16/13  5:25 AM      Result Value Range   Lipase 1002 (*) 11 - 59 U/L  GLUCOSE, CAPILLARY     Status: None   Collection Time    09/16/13 11:59 AM      Result Value Range   Glucose-Capillary 80  70 - 99 mg/dL   Comment 1 Notify RN    GLUCOSE, CAPILLARY     Status: Abnormal   Collection Time    09/16/13  4:36 PM      Result Value Range   Glucose-Capillary 110 (*) 70 - 99 mg/dL    Studies/Results: Ct Abdomen Pelvis W  Contrast  09/14/2013   CLINICAL DATA:  Abdominal pain and emesis. Pancreatitis  EXAM: CT ABDOMEN AND PELVIS WITH CONTRAST  TECHNIQUE: Multidetector CT imaging of the abdomen and pelvis was performed using the standard protocol following bolus administration of intravenous contrast.  CONTRAST:  OMNIPAQUE IOHEXOL 300 MG/ML  SOLN  COMPARISON:  None.  FINDINGS: BODY WALL: Unremarkable.  LOWER CHEST: Moderate sliding-type hiatal hernia with retained oral contrast.  ABDOMEN/PELVIS:  Liver: No focal abnormality.  Biliary: Cholecystectomy. There is a 4 mm stone/calcification in the region of the confluence of the main pancreatic duct and CBD. The common bile duct is not particularly dilated, measuring at most 7 mm near the hilum.  Pancreas: Pancreatic enlargement and low-attenuation with extensive peripancreatic fluid. No formed collection  or evidence of necrosis. No vascular complications seen.  Spleen: Unremarkable.  Adrenals: Unremarkable.  Kidneys and ureters: 4 cm interpolar cyst on the left. No stone or hydronephrosis.  Bladder: Unremarkable.  Reproductive: Heterogeneously enhancing uterus with multiple surface lobulations, consistent with fibroids. 1 of the largest fibroids is in the region of the left cornua, measuring 3 cm in diameter.  Bowel: Reactive thickening of the duodenum. Normal appendix.  Retroperitoneum: Pancreatitis edema as above.  Peritoneum: Small volume free fluid in the pelvis.  Vascular: No acute abnormality.  OSSEOUS: No acute abnormalities.  IMPRESSION: 1. Acute interstitial pancreatitis without necrosis or organized collection. Suspect choledocholithiasis with a 4mm stone just proximal to the major papilla. 2. Cholecystectomy. 3. Moderate sliding hiatal hernia. 4. Fibroid uterus.   Electronically Signed   By: Tiburcio Pea M.D.   On: 09/14/2013 23:23   Mr 3d Recon At Scanner  09/16/2013   CLINICAL DATA:  Previous history of pancreatitis. Persistent abdominal pain. Current acute  pancreatitis with potential obstructing calculus seen on comparison CT.  EXAM: MR ABDOMEN WO/W CM MRCP; MR 3D RECON AT SCANNER  TECHNIQUE: Multiplanar multisequence MR imaging of the abdomen was performed, including heavily T2-weighted images of the biliary and pancreatic ducts. Three-dimensional MR images were rendered by post processing of the original MR data.  CONTRAST:  18mL MULTIHANCE GADOBENATE DIMEGLUMINE 529 MG/ML IV SOLN  COMPARISON:  CT 09/14/2013  FINDINGS: There is no intrahepatic biliary duct dilatation. Patient status post cholecystectomy. The common bile duct is upper limits of normal at 6 mm. The pancreatic duct is mildly dilated to 4 mm. The calculus seen on comparison CT at the level of the confluence of the pancreatic duct and common bile duct is not well appreciated. There are no discrete filling defects within the common bile duct or pancreatic duct. There is a potential signal void at the ampulla which could represent a calculus but si indeterminate. (image 49 series 10 and image 17, series 4.). No variant pancreatic ductal anatomy.  There is evidence of acute pancreatitis with edema of the pancreatic head and body as well as peripancreatic fluid. The pancreatic duct is uninterrupted. There are no organized fluid collections. The pancreatic head and body enhance without evidence of necrosis. Small fluid extends along the anterior perirenal space.  The spleen is normal. Adrenal glands and kidneys are normal. There is a simple cyst within the left kidney.  IMPRESSION: 1. The small calculus at the confluence of the pancreatic duct and common bile duct is betterdemonstrated on comparison CT. Potential signal void at this level is indeterminate.  2. Acute pancreatitis with pancreatic inflammation and peripancreatic fluid. No evidence of organized fluid collections. No evidence of pancreatic necrosis or pancreatic ductal interruption.  3. No evidence of biliary obstruction.  4. No variant pancreatic  ductal anatomy.   Electronically Signed   By: Genevive Bi M.D.   On: 09/16/2013 11:10   Mr Abd W/wo Cm/mrcp  09/16/2013   CLINICAL DATA:  Previous history of pancreatitis. Persistent abdominal pain. Current acute pancreatitis with potential obstructing calculus seen on comparison CT.  EXAM: MR ABDOMEN WO/W CM MRCP; MR 3D RECON AT SCANNER  TECHNIQUE: Multiplanar multisequence MR imaging of the abdomen was performed, including heavily T2-weighted images of the biliary and pancreatic ducts. Three-dimensional MR images were rendered by post processing of the original MR data.  CONTRAST:  18mL MULTIHANCE GADOBENATE DIMEGLUMINE 529 MG/ML IV SOLN  COMPARISON:  CT 09/14/2013  FINDINGS: There is no intrahepatic biliary duct dilatation.  Patient status post cholecystectomy. The common bile duct is upper limits of normal at 6 mm. The pancreatic duct is mildly dilated to 4 mm. The calculus seen on comparison CT at the level of the confluence of the pancreatic duct and common bile duct is not well appreciated. There are no discrete filling defects within the common bile duct or pancreatic duct. There is a potential signal void at the ampulla which could represent a calculus but si indeterminate. (image 49 series 10 and image 17, series 4.). No variant pancreatic ductal anatomy.  There is evidence of acute pancreatitis with edema of the pancreatic head and body as well as peripancreatic fluid. The pancreatic duct is uninterrupted. There are no organized fluid collections. The pancreatic head and body enhance without evidence of necrosis. Small fluid extends along the anterior perirenal space.  The spleen is normal. Adrenal glands and kidneys are normal. There is a simple cyst within the left kidney.  IMPRESSION: 1. The small calculus at the confluence of the pancreatic duct and common bile duct is betterdemonstrated on comparison CT. Potential signal void at this level is indeterminate.  2. Acute pancreatitis with  pancreatic inflammation and peripancreatic fluid. No evidence of organized fluid collections. No evidence of pancreatic necrosis or pancreatic ductal interruption.  3. No evidence of biliary obstruction.  4. No variant pancreatic ductal anatomy.   Electronically Signed   By: Genevive Bi M.D.   On: 09/16/2013 11:10      Assessment: Gallstone pancreatitis Poss stone at confluence of PD, CBD  Plan: ERCP tomorrow    Ameya Vowell C 09/16/2013, 5:16 PM

## 2013-09-17 ENCOUNTER — Encounter (HOSPITAL_COMMUNITY): Payer: Self-pay | Admitting: *Deleted

## 2013-09-17 ENCOUNTER — Encounter (HOSPITAL_COMMUNITY)
Admission: EM | Disposition: A | Discharge status: Home / Self Care | Payer: Self-pay | Source: Home / Self Care | Attending: Internal Medicine

## 2013-09-17 ENCOUNTER — Inpatient Hospital Stay (HOSPITAL_COMMUNITY): Payer: Commercial Indemnity

## 2013-09-17 HISTORY — PX: ERCP: SHX5425

## 2013-09-17 LAB — COMPREHENSIVE METABOLIC PANEL
ALT: 14 U/L (ref 0–35)
AST: 20 U/L (ref 0–37)
CO2: 23 mEq/L (ref 19–32)
Calcium: 8.4 mg/dL (ref 8.4–10.5)
Creatinine, Ser: 0.58 mg/dL (ref 0.50–1.10)
GFR calc non Af Amer: >90 mL/min (ref >90)
Sodium: 136 mEq/L (ref 135–145)
Total Protein: 6.2 g/dL (ref 6.0–8.3)

## 2013-09-17 LAB — GLUCOSE, CAPILLARY: Glucose-Capillary: 103 mg/dL — ABNORMAL HIGH (ref 70–99)

## 2013-09-17 LAB — LIPASE, BLOOD: Lipase: 213 U/L — ABNORMAL HIGH (ref 11–59)

## 2013-09-17 SURGERY — ERCP, WITH INTERVENTION IF INDICATED
Anesthesia: Moderate Sedation

## 2013-09-17 MED ORDER — CALCIUM CARBONATE ANTACID 500 MG PO CHEW
1.0000 | CHEWABLE_TABLET | Freq: Three times a day (TID) | ORAL | Status: DC | PRN
  Filled 2013-09-17: qty 1

## 2013-09-17 MED ORDER — MIDAZOLAM HCL 10 MG/2ML IJ SOLN
INTRAMUSCULAR | Status: DC | PRN
  Administered 2013-09-17 (×2): 2 mg via INTRAVENOUS
  Administered 2013-09-17: 1 mg via INTRAVENOUS
  Administered 2013-09-17: 2 mg via INTRAVENOUS
  Administered 2013-09-17: 1 mg via INTRAVENOUS
  Administered 2013-09-17: 2 mg via INTRAVENOUS
  Administered 2013-09-17: 1 mg via INTRAVENOUS
  Administered 2013-09-17: 2 mg via INTRAVENOUS
  Administered 2013-09-17: 1 mg via INTRAVENOUS

## 2013-09-17 MED ORDER — ONDANSETRON HCL 4 MG/2ML IJ SOLN
INTRAMUSCULAR | Status: DC | PRN
  Administered 2013-09-17: 4 mg via INTRAVENOUS

## 2013-09-17 MED ORDER — BUTAMBEN-TETRACAINE-BENZOCAINE 2-2-14 % EX AERO
INHALATION_SPRAY | CUTANEOUS | Status: DC | PRN
  Administered 2013-09-17: 2 via TOPICAL

## 2013-09-17 MED ORDER — FENTANYL CITRATE 0.05 MG/ML IJ SOLN
INTRAMUSCULAR | Status: AC
  Filled 2013-09-17: qty 2

## 2013-09-17 MED ORDER — SODIUM CHLORIDE 0.9 % IV SOLN: INTRAVENOUS | Status: DC

## 2013-09-17 MED ORDER — MIDAZOLAM HCL 5 MG/ML IJ SOLN
INTRAMUSCULAR | Status: AC
  Filled 2013-09-17: qty 1

## 2013-09-17 MED ORDER — FENTANYL CITRATE 0.05 MG/ML IJ SOLN
INTRAMUSCULAR | Status: AC
  Filled 2013-09-17: qty 4

## 2013-09-17 MED ORDER — ONDANSETRON HCL 4 MG/2ML IJ SOLN
INTRAMUSCULAR | Status: AC
  Filled 2013-09-17: qty 2

## 2013-09-17 MED ORDER — GLUCAGON HCL (RDNA) 1 MG IJ SOLR
INTRAMUSCULAR | Status: AC
  Filled 2013-09-17: qty 2

## 2013-09-17 MED ORDER — DIPHENHYDRAMINE HCL 50 MG/ML IJ SOLN
INTRAMUSCULAR | Status: AC
  Filled 2013-09-17: qty 1

## 2013-09-17 MED ORDER — MIDAZOLAM HCL 5 MG/ML IJ SOLN
INTRAMUSCULAR | Status: AC
  Filled 2013-09-17: qty 3

## 2013-09-17 MED ORDER — DIPHENHYDRAMINE HCL 50 MG/ML IJ SOLN
INTRAMUSCULAR | Status: DC | PRN
  Administered 2013-09-17: 25 mg via INTRAVENOUS

## 2013-09-17 MED ORDER — FENTANYL CITRATE 0.05 MG/ML IJ SOLN
INTRAMUSCULAR | Status: DC | PRN
  Administered 2013-09-17 (×7): 25 ug via INTRAVENOUS

## 2013-09-17 NOTE — Progress Notes (Signed)
[facility] PROGRESS NOTE  [name] ZOX:[id number] DOB: Jun 12, 1956 DOA: 09/14/2013 PCP: Lupita Raider, MD  Subjective: Julie Cross consistently getting better her pain today as 2/10.  Assessment/Plan:  1. Acute pancreatitis with choledocholithiasis  -Pancreatitis secondary to choledocholithiasis s/p cholecystectomy -Presented with lipase of 2831, lipase is improving, today is 213 -CT shows pancreatic enlargement with no evidence of necrosis; 4mm stone at confluence of main pancreatic duct and CBD.  -CT scans showed 4 mm stone, MRCP on 10/31 did not confirm it. -Patient is improving without intervention, lipase is trending down consistently as well as patient's symptoms. -? Patient passed the stone, but MRCP is inconclusive, patient for ERCP today. -Likely to advance diet as tolerated after the procedure, continue pain control and IV fluids.  2. History of hypothyroidism  -Pt receiving IV levothyroxine.  Code Status: full Family Communication: none Disposition Plan: inpatient   Consultants:  GI  Procedures:  MRCP to be done 10/31  Antibiotics: None  HPI/Subjective: Julie Cross is a 57 y.o. female history of hypothyroidism and previous history of pancreatitis last year presents to the year because of persistent abdominal pain since yesterday morning. The pain is epigastric in a.m. nonradiating sharp. In the ER patient had at least 3 episodes of nausea and vomiting. Denies any diarrhea. CT abdomen and pelvis shows features consistent with pancreatitis with choledocholithiasis. Patient states that when she had pancreatitis last is she was told that she has had passed a stone. She had followed up with Dr.Outlaw gastroenterologist and at the time plan was to have further studies if patient had recurrent pancreatitis. Patient otherwise denies any chest pain or shortness of breath.   As of 10/31 patient is less pain (rates a 3 of 10) and less nausea.   Objective: Filed  Vitals:   09/17/13 0500  BP: 124/76  Pulse: 72  Temp: 99.4 F (37.4 C)  Resp: 18    Intake/Output Summary (Last 24 hours) at 09/17/13 1027 Last data filed at 09/17/13 0815  Gross per 24 hour  Intake 2068.33 ml  Output      1 ml  Net 2067.33 ml   Filed Weights   09/15/13 0036  Weight: 85.5 kg (188 lb 7.9 oz)    Exam:   General:  wdwn female in nad  Cardiovascular: normal s1 s2, RRR, no m/g/r  Respiratory: clear to ausc bilat, no inc wob  Abdomen: +normoactive BS throughout, nondistended, slightly tender but decreased from yesterday  Musculoskeletal: intact ROM x4, no edema  Data Reviewed: Basic Metabolic Panel:  Recent Labs Lab 09/14/13 1717 09/15/13 0443 09/16/13 0525 09/17/13 0608  NA 138 137 138 136  K 4.7 4.4 3.4* 3.6  CL 100 103 104 101  CO2 22 24 24 23   GLUCOSE 171* 130* 89 102*  BUN 15 11 7 8   CREATININE 0.72 0.67 0.58 0.58  CALCIUM 9.8 8.9 8.0* 8.4   Liver Function Tests:  Recent Labs Lab 09/14/13 1717 09/15/13 0443 09/16/13 0525 09/17/13 0608  AST 16 15 14 20   ALT 12 10 9 14   ALKPHOS 60 55 48 88  BILITOT 0.6 0.6 0.6 0.6  PROT 7.5 7.0 5.9* 6.2  ALBUMIN 4.1 3.7 2.9* 2.9*    Recent Labs Lab 09/14/13 1717 09/15/13 0443 09/16/13 0525 09/17/13 0608  LIPASE 2831* 2312* 1002* 213*    CBC:  Recent Labs Lab 09/14/13 1717 09/15/13 0443  WBC 9.8 11.0*  NEUTROABS 8.3*  --   HGB 14.7 13.6  HCT 43.3 40.3  MCV 84.1 84.5  PLT 273 276   CBG:  Recent Labs Lab 2013-10-11 0513 Oct 11, 2013 1159 10-11-2013 1636 Oct 11, 2013 2356 09/17/13 0654  GLUCAP 93 80 110* 95 103*       Studies: Mr 3d Recon At Scanner  October 11, 2013   CLINICAL DATA:  Previous history of pancreatitis. Persistent abdominal pain. Current acute pancreatitis with potential obstructing calculus seen on comparison CT.  EXAM: MR ABDOMEN WO/W CM MRCP; MR 3D RECON AT SCANNER  TECHNIQUE: Multiplanar multisequence MR imaging of the abdomen was performed, including heavily  T2-weighted images of the biliary and pancreatic ducts. Three-dimensional MR images were rendered by post processing of the original MR data.  CONTRAST:  18mL MULTIHANCE GADOBENATE DIMEGLUMINE 529 MG/ML IV SOLN  COMPARISON:  CT 09/14/2013  FINDINGS: There is no intrahepatic biliary duct dilatation. Patient status post cholecystectomy. The common bile duct is upper limits of normal at 6 mm. The pancreatic duct is mildly dilated to 4 mm. The calculus seen on comparison CT at the level of the confluence of the pancreatic duct and common bile duct is not well appreciated. There are no discrete filling defects within the common bile duct or pancreatic duct. There is a potential signal void at the ampulla which could represent a calculus but si indeterminate. (image 49 series 10 and image 17, series 4.). No variant pancreatic ductal anatomy.  There is evidence of acute pancreatitis with edema of the pancreatic head and body as well as peripancreatic fluid. The pancreatic duct is uninterrupted. There are no organized fluid collections. The pancreatic head and body enhance without evidence of necrosis. Small fluid extends along the anterior perirenal space.  The spleen is normal. Adrenal glands and kidneys are normal. There is a simple cyst within the left kidney.  IMPRESSION: 1. The small calculus at the confluence of the pancreatic duct and common bile duct is betterdemonstrated on comparison CT. Potential signal void at this level is indeterminate.  2. Acute pancreatitis with pancreatic inflammation and peripancreatic fluid. No evidence of organized fluid collections. No evidence of pancreatic necrosis or pancreatic ductal interruption.  3. No evidence of biliary obstruction.  4. No variant pancreatic ductal anatomy.   Electronically Signed   By: Genevive Bi M.D.   On: 10/11/2013 11:10   Mr Abd W/wo Cm/mrcp  October 11, 2013   CLINICAL DATA:  Previous history of pancreatitis. Persistent abdominal pain. Current acute  pancreatitis with potential obstructing calculus seen on comparison CT.  EXAM: MR ABDOMEN WO/W CM MRCP; MR 3D RECON AT SCANNER  TECHNIQUE: Multiplanar multisequence MR imaging of the abdomen was performed, including heavily T2-weighted images of the biliary and pancreatic ducts. Three-dimensional MR images were rendered by post processing of the original MR data.  CONTRAST:  18mL MULTIHANCE GADOBENATE DIMEGLUMINE 529 MG/ML IV SOLN  COMPARISON:  CT 09/14/2013  FINDINGS: There is no intrahepatic biliary duct dilatation. Patient status post cholecystectomy. The common bile duct is upper limits of normal at 6 mm. The pancreatic duct is mildly dilated to 4 mm. The calculus seen on comparison CT at the level of the confluence of the pancreatic duct and common bile duct is not well appreciated. There are no discrete filling defects within the common bile duct or pancreatic duct. There is a potential signal void at the ampulla which could represent a calculus but si indeterminate. (image 49 series 10 and image 17, series 4.). No variant pancreatic ductal anatomy.  There is evidence of acute pancreatitis with edema of the pancreatic head and body as well as peripancreatic  fluid. The pancreatic duct is uninterrupted. There are no organized fluid collections. The pancreatic head and body enhance without evidence of necrosis. Small fluid extends along the anterior perirenal space.  The spleen is normal. Adrenal glands and kidneys are normal. There is a simple cyst within the left kidney.  IMPRESSION: 1. The small calculus at the confluence of the pancreatic duct and common bile duct is betterdemonstrated on comparison CT. Potential signal void at this level is indeterminate.  2. Acute pancreatitis with pancreatic inflammation and peripancreatic fluid. No evidence of organized fluid collections. No evidence of pancreatic necrosis or pancreatic ductal interruption.  3. No evidence of biliary obstruction.  4. No variant pancreatic  ductal anatomy.   Electronically Signed   By: Genevive Bi M.D.   On: 09/16/2013 11:10    Scheduled Meds: . antiseptic oral rinse  15 mL Mouth Rinse q12n4p  . chlorhexidine  15 mL Mouth Rinse BID  . levothyroxine  50 mcg Intravenous QAC breakfast   Continuous Infusions: . sodium chloride 0.9 % 1,000 mL with potassium chloride 20 mEq infusion 100 mL/hr at 09/17/13 5784    Principal Problem:   Pancreatitis Active Problems:   Choledocholithiasis   Hypothyroidism    Clint Lipps, MD [facility] Pager: [phone] 09/17/2013, 10:27 AM

## 2013-09-17 NOTE — Op Note (Signed)
[facility] [address] [place], [address]   ERCP PROCEDURE REPORT  PATIENT: Julie Cross, Julie Cross  MR# :[id number] BIRTHDATE: 10/14/1956  GENDER: Female ENDOSCOPIST: Dorena Cookey, MD REFERRED BY: PROCEDURE DATE:  09/17/2013 PROCEDURE: ASA CLASS: INDICATIONS:suspected common bile duct stone MEDICATIONS: fentanyl 175 mcg, Versed 14 mg, Benadryl 25 TOPICAL ANESTHETIC:  DESCRIPTION OF PROCEDURE:   After the risks benefits and alternatives of the procedure were thoroughly explained, informed consent was obtained.  The Pentax side-viewing       endoscope was introduced through the mouth  and advanced to the the papilla of Vater.      .  clear amber bile was seen to exit from the ampulla on several occasions. I could not easily get deep selective cannulation. Eventually cannulation of the pancreatic duct was achieved and a pancreatogram obtained. Pancreatic duct appeared normal and I did not see any calculus. I could not maneuver the scope in the selectively cannulated the common bile duct and attempts to recannulate the pancreatic duct to place a pancreatic stent were unsuccessful. Also the patient did not tolerate the procedure well and was becoming restless despite the medicine given.        The scope was then completely withdrawn from the patient and the procedure terminated.     COMPLICATIONS: none immediate  ENDOSCOPIC IMPRESSION: normal pancreatogram, unsuccessful attempt at cannulating common bile duct. RECOMMENDATIONS: consider repeating procedure under general anesthesia.    _______________________________ Rosalie DoctorDorena Cookey, MD 09/17/2013 11:35 AM   CC:

## 2013-09-17 NOTE — Progress Notes (Signed)
[facility] Progress Note  Subjective: Feels better this morning prior to ERCP  Objective: Vital signs in last 24 hours: Temp:  [99.3 F (37.4 C)-99.9 F (37.7 C)] 99.4 F (37.4 C) (11/01 0500) Pulse Rate:  [72-78] 72 (11/01 0500) Resp:  [18-20] 18 (11/01 0500) BP: (124-167)/(74-83) 124/76 mmHg (11/01 0500) SpO2:  [94 %-95 %] 95 % (11/01 0500) Weight change:    PE: Abdomen less tender  Lab Results: Results for orders placed during the hospital encounter of 09/14/13 (from the past 24 hour(s))  GLUCOSE, CAPILLARY     Status: None   Collection Time    09/16/13 11:59 AM      Result Value Range   Glucose-Capillary 80  70 - 99 mg/dL   Comment 1 Notify RN    GLUCOSE, CAPILLARY     Status: Abnormal   Collection Time    09/16/13  4:36 PM      Result Value Range   Glucose-Capillary 110 (*) 70 - 99 mg/dL  GLUCOSE, CAPILLARY     Status: None   Collection Time    09/16/13 11:56 PM      Result Value Range   Glucose-Capillary 95  70 - 99 mg/dL  GLUCOSE, CAPILLARY     Status: Abnormal   Collection Time    09/17/13  6:54 AM      Result Value Range   Glucose-Capillary 103 (*) 70 - 99 mg/dL    Studies/Results: Mr 3d Recon At Scanner  09/16/2013   CLINICAL DATA:  Previous history of pancreatitis. Persistent abdominal pain. Current acute pancreatitis with potential obstructing calculus seen on comparison CT.  EXAM: MR ABDOMEN WO/W CM MRCP; MR 3D RECON AT SCANNER  TECHNIQUE: Multiplanar multisequence MR imaging of the abdomen was performed, including heavily T2-weighted images of the biliary and pancreatic ducts. Three-dimensional MR images were rendered by post processing of the original MR data.  CONTRAST:  18mL MULTIHANCE GADOBENATE DIMEGLUMINE 529 MG/ML IV SOLN  COMPARISON:  CT 09/14/2013  FINDINGS: There is no intrahepatic biliary duct dilatation. Patient status post cholecystectomy. The common bile duct is upper limits of normal at 6 mm. The pancreatic duct is mildly  dilated to 4 mm. The calculus seen on comparison CT at the level of the confluence of the pancreatic duct and common bile duct is not well appreciated. There are no discrete filling defects within the common bile duct or pancreatic duct. There is a potential signal void at the ampulla which could represent a calculus but si indeterminate. (image 49 series 10 and image 17, series 4.). No variant pancreatic ductal anatomy.  There is evidence of acute pancreatitis with edema of the pancreatic head and body as well as peripancreatic fluid. The pancreatic duct is uninterrupted. There are no organized fluid collections. The pancreatic head and body enhance without evidence of necrosis. Small fluid extends along the anterior perirenal space.  The spleen is normal. Adrenal glands and kidneys are normal. There is a simple cyst within the left kidney.  IMPRESSION: 1. The small calculus at the confluence of the pancreatic duct and common bile duct is betterdemonstrated on comparison CT. Potential signal void at this level is indeterminate.  2. Acute pancreatitis with pancreatic inflammation and peripancreatic fluid. No evidence of organized fluid collections. No evidence of pancreatic necrosis or pancreatic ductal interruption.  3. No evidence of biliary obstruction.  4. No variant pancreatic ductal anatomy.   Electronically Signed   By: Genevive Bi M.D.   On: 09/16/2013 11:10  Mr Abd W/wo Cm/mrcp  09/16/2013   CLINICAL DATA:  Previous history of pancreatitis. Persistent abdominal pain. Current acute pancreatitis with potential obstructing calculus seen on comparison CT.  EXAM: MR ABDOMEN WO/W CM MRCP; MR 3D RECON AT SCANNER  TECHNIQUE: Multiplanar multisequence MR imaging of the abdomen was performed, including heavily T2-weighted images of the biliary and pancreatic ducts. Three-dimensional MR images were rendered by post processing of the original MR data.  CONTRAST:  18mL MULTIHANCE GADOBENATE DIMEGLUMINE 529  MG/ML IV SOLN  COMPARISON:  CT 09/14/2013  FINDINGS: There is no intrahepatic biliary duct dilatation. Patient status post cholecystectomy. The common bile duct is upper limits of normal at 6 mm. The pancreatic duct is mildly dilated to 4 mm. The calculus seen on comparison CT at the level of the confluence of the pancreatic duct and common bile duct is not well appreciated. There are no discrete filling defects within the common bile duct or pancreatic duct. There is a potential signal void at the ampulla which could represent a calculus but si indeterminate. (image 49 series 10 and image 17, series 4.). No variant pancreatic ductal anatomy.  There is evidence of acute pancreatitis with edema of the pancreatic head and body as well as peripancreatic fluid. The pancreatic duct is uninterrupted. There are no organized fluid collections. The pancreatic head and body enhance without evidence of necrosis. Small fluid extends along the anterior perirenal space.  The spleen is normal. Adrenal glands and kidneys are normal. There is a simple cyst within the left kidney.  IMPRESSION: 1. The small calculus at the confluence of the pancreatic duct and common bile duct is betterdemonstrated on comparison CT. Potential signal void at this level is indeterminate.  2. Acute pancreatitis with pancreatic inflammation and peripancreatic fluid. No evidence of organized fluid collections. No evidence of pancreatic necrosis or pancreatic ductal interruption.  3. No evidence of biliary obstruction.  4. No variant pancreatic ductal anatomy.   Electronically Signed   By: Genevive Bi M.D.   On: 09/16/2013 11:10      Assessment: ERCP: Unable to cannulate common bile duct. Pancreatogram appeared normal with no obvious calculus. Patient did not sedate well  Plan: Continue supportive care. Consider repeat ERCP under general anesthesia in the next 2-3 days.    Estellar Cadena C 09/17/2013, 7:33 AM

## 2013-09-18 LAB — COMPREHENSIVE METABOLIC PANEL
ALT: 32 U/L (ref 0–35)
AST: 29 U/L (ref 0–37)
Albumin: 2.6 g/dL — ABNORMAL LOW (ref 3.5–5.2)
Alkaline Phosphatase: 124 U/L — ABNORMAL HIGH (ref 39–117)
Chloride: 100 mEq/L (ref 96–112)
GFR calc Af Amer: >90 mL/min (ref >90)
Potassium: 3.4 mEq/L — ABNORMAL LOW (ref 3.5–5.1)
Sodium: 136 mEq/L (ref 135–145)
Total Bilirubin: 0.5 mg/dL (ref 0.3–1.2)
Total Protein: 6 g/dL (ref 6.0–8.3)

## 2013-09-18 LAB — LIPASE, BLOOD: Lipase: 52 U/L (ref 11–59)

## 2013-09-18 LAB — GLUCOSE, CAPILLARY
Glucose-Capillary: 79 mg/dL (ref 70–99)
Glucose-Capillary: 89 mg/dL (ref 70–99)

## 2013-09-18 NOTE — Discharge Summary (Signed)
Physician Discharge Summary  Ardean Simonich ZOX:[id number] DOB: 1955-11-27 DOA: 09/14/2013  PCP: Lupita Raider, MD  Admit date: 09/14/2013 Discharge date: 09/18/2013  Time spent: 40 minutes  Recommendations for Outpatient Follow-up:  1. Followup with primary care physician within one week. 2. Followup with [facility] gastroenterology, possible ERCP next week.  Discharge Diagnoses:  Principal Problem:   Pancreatitis Active Problems:   Choledocholithiasis   Hypothyroidism   Discharge Condition: Stable  Diet recommendation: Regular, avoid greasy food  Filed Weights   09/15/13 0036  Weight: 85.5 kg (188 lb 7.9 oz)    History of present illness:  Julie Cross is a 57 y.o. female history of hypothyroidism and previous history of pancreatitis last year presents to the year because of persistent abdominal pain since yesterday morning. The pain is epigastric in a.m. nonradiating sharp. In the ER patient had at least 3 episodes of nausea and vomiting. Denies any diarrhea. CT abdomen and pelvis shows features consistent with pancreatitis with choledocholithiasis. Patient states that when she had pancreatitis last is she was told that she has had passed a stone. She had followed up with Dr.Outlaw gastroenterologist and at the time plan was to have further studies if patient had recurrent pancreatitis. Patient otherwise denies any chest pain or shortness of breath.   Hospital Course:   1. Acute pancreatitis: Patient presented to the hospital with acute pancreatitis with lipase of 2831, CT scan of abdomen/pelvis done in the emergency department and showed cholecystectomy but there is choledocholithiasis. There is 4 mm stone at the confluence of the main pancreatic duct and CBD. MRCP done on 10/31 did not confirm it. ERCP was done on 09/17/2013 which was difficult secondary to patient inadequate sedation. Patient did well and her lipase normalized today, her pain resolved, she was able to tolerate  clear liquids to now and she requested to be discharged. [facility] okay to discharge, there will contact her next week for possible ERCP under sedation next week.   2. Choledocholithiasis: As mentioned above this only showed a CT scan, MRCP and ERCP did not confirm it. A possibility that she had a stone that she passed it, other possibility this could have been a small calcification around the pancreatic duct. Please note that patient has had previous episode of acute pancreatitis, she was presented to [facility] and she was treated with outpatient, basically she had the same findings with small stone.  3. History of hypothyroidism: On levothyroxine, continued throughout the hospital stay.  Procedures:  ERCP done on 09/17/2013, technically difficult patient was not adequately sedated.  Consultations:  Dorena Cookey of [facility] gastroenterology  Discharge Exam: Filed Vitals:   09/18/13 0605  BP: 139/78  Pulse: 64  Temp: 98.9 F (37.2 C)  Resp: 18   General: Alert and awake, oriented x3, not in any acute distress. HEENT: anicteric sclera, pupils reactive to light and accommodation, EOMI CVS: S1-S2 clear, no murmur rubs or gallops Chest: clear to auscultation bilaterally, no wheezing, rales or rhonchi Abdomen: soft nontender, nondistended, normal bowel sounds, no organomegaly Extremities: no cyanosis, clubbing or edema noted bilaterally Neuro: Cranial nerves II-XII intact, no focal neurological deficits  Discharge Instructions  Discharge Orders   Future Orders Complete By Expires   Diet general  As directed        Medication List         HYDROcodone-acetaminophen 5-500 MG per tablet  Commonly known as:  VICODIN  Take 1 tablet by mouth every 6 (six) hours as needed for pain (PAIN).  levothyroxine 100 MCG tablet  Commonly known as:  SYNTHROID, LEVOTHROID  Take 100 mcg by mouth daily before breakfast.       Allergies  Allergen Reactions  . Gabapentin  Other (See Comments)    SI       Follow-up Information   Follow up with SHAW,KIMBERLEE, MD In 1 week.   Specialty:  Family Medicine   Contact information:   [address] [place] [address] [phone]       Follow up with Freddy Jaksch, MD. (Office will call you.)    Specialty:  Gastroenterology   Contact information:   [address], [address] [place] [address] [phone]        The results of significant diagnostics from this hospitalization (including imaging, microbiology, ancillary and laboratory) are listed below for reference.    Significant Diagnostic Studies: Ct Abdomen Pelvis W Contrast  09/14/2013   CLINICAL DATA:  Abdominal pain and emesis. Pancreatitis  EXAM: CT ABDOMEN AND PELVIS WITH CONTRAST  TECHNIQUE: Multidetector CT imaging of the abdomen and pelvis was performed using the standard protocol following bolus administration of intravenous contrast.  CONTRAST:  OMNIPAQUE IOHEXOL 300 MG/ML  SOLN  COMPARISON:  None.  FINDINGS: BODY WALL: Unremarkable.  LOWER CHEST: Moderate sliding-type hiatal hernia with retained oral contrast.  ABDOMEN/PELVIS:  Liver: No focal abnormality.  Biliary: Cholecystectomy. There is a 4 mm stone/calcification in the region of the confluence of the main pancreatic duct and CBD. The common bile duct is not particularly dilated, measuring at most 7 mm near the hilum.  Pancreas: Pancreatic enlargement and low-attenuation with extensive peripancreatic fluid. No formed collection or evidence of necrosis. No vascular complications seen.  Spleen: Unremarkable.  Adrenals: Unremarkable.  Kidneys and ureters: 4 cm interpolar cyst on the left. No stone or hydronephrosis.  Bladder: Unremarkable.  Reproductive: Heterogeneously enhancing uterus with multiple surface lobulations, consistent with fibroids. 1 of the largest fibroids is in the region of the left cornua, measuring 3 cm in diameter.  Bowel: Reactive thickening  of the duodenum. Normal appendix.  Retroperitoneum: Pancreatitis edema as above.  Peritoneum: Small volume free fluid in the pelvis.  Vascular: No acute abnormality.  OSSEOUS: No acute abnormalities.  IMPRESSION: 1. Acute interstitial pancreatitis without necrosis or organized collection. Suspect choledocholithiasis with a 4mm stone just proximal to the major papilla. 2. Cholecystectomy. 3. Moderate sliding hiatal hernia. 4. Fibroid uterus.   Electronically Signed   By: Tiburcio Pea M.D.   On: 09/14/2013 23:23   Mr 3d Recon At Scanner  09/16/2013   CLINICAL DATA:  Previous history of pancreatitis. Persistent abdominal pain. Current acute pancreatitis with potential obstructing calculus seen on comparison CT.  EXAM: MR ABDOMEN WO/W CM MRCP; MR 3D RECON AT SCANNER  TECHNIQUE: Multiplanar multisequence MR imaging of the abdomen was performed, including heavily T2-weighted images of the biliary and pancreatic ducts. Three-dimensional MR images were rendered by post processing of the original MR data.  CONTRAST:  18mL MULTIHANCE GADOBENATE DIMEGLUMINE 529 MG/ML IV SOLN  COMPARISON:  CT 09/14/2013  FINDINGS: There is no intrahepatic biliary duct dilatation. Patient status post cholecystectomy. The common bile duct is upper limits of normal at 6 mm. The pancreatic duct is mildly dilated to 4 mm. The calculus seen on comparison CT at the level of the confluence of the pancreatic duct and common bile duct is not well appreciated. There are no discrete filling defects within the common bile duct or pancreatic duct. There is  a potential signal void at the ampulla which could represent a calculus but si indeterminate. (image 49 series 10 and image 17, series 4.). No variant pancreatic ductal anatomy.  There is evidence of acute pancreatitis with edema of the pancreatic head and body as well as peripancreatic fluid. The pancreatic duct is uninterrupted. There are no organized fluid collections. The pancreatic head and  body enhance without evidence of necrosis. Small fluid extends along the anterior perirenal space.  The spleen is normal. Adrenal glands and kidneys are normal. There is a simple cyst within the left kidney.  IMPRESSION: 1. The small calculus at the confluence of the pancreatic duct and common bile duct is betterdemonstrated on comparison CT. Potential signal void at this level is indeterminate.  2. Acute pancreatitis with pancreatic inflammation and peripancreatic fluid. No evidence of organized fluid collections. No evidence of pancreatic necrosis or pancreatic ductal interruption.  3. No evidence of biliary obstruction.  4. No variant pancreatic ductal anatomy.   Electronically Signed   By: Genevive Bi M.D.   On: 09/16/2013 11:10   Dg C-arm 1-60 Min-no Report  09/17/2013   CLINICAL DATA: failed ERCP   C-ARM 1-60 MINUTES  Fluoroscopy was utilized by the requesting physician.  No radiographic  interpretation.    Mr Abd W/wo Cm/mrcp  09/16/2013   CLINICAL DATA:  Previous history of pancreatitis. Persistent abdominal pain. Current acute pancreatitis with potential obstructing calculus seen on comparison CT.  EXAM: MR ABDOMEN WO/W CM MRCP; MR 3D RECON AT SCANNER  TECHNIQUE: Multiplanar multisequence MR imaging of the abdomen was performed, including heavily T2-weighted images of the biliary and pancreatic ducts. Three-dimensional MR images were rendered by post processing of the original MR data.  CONTRAST:  18mL MULTIHANCE GADOBENATE DIMEGLUMINE 529 MG/ML IV SOLN  COMPARISON:  CT 09/14/2013  FINDINGS: There is no intrahepatic biliary duct dilatation. Patient status post cholecystectomy. The common bile duct is upper limits of normal at 6 mm. The pancreatic duct is mildly dilated to 4 mm. The calculus seen on comparison CT at the level of the confluence of the pancreatic duct and common bile duct is not well appreciated. There are no discrete filling defects within the common bile duct or pancreatic duct.  There is a potential signal void at the ampulla which could represent a calculus but si indeterminate. (image 49 series 10 and image 17, series 4.). No variant pancreatic ductal anatomy.  There is evidence of acute pancreatitis with edema of the pancreatic head and body as well as peripancreatic fluid. The pancreatic duct is uninterrupted. There are no organized fluid collections. The pancreatic head and body enhance without evidence of necrosis. Small fluid extends along the anterior perirenal space.  The spleen is normal. Adrenal glands and kidneys are normal. There is a simple cyst within the left kidney.  IMPRESSION: 1. The small calculus at the confluence of the pancreatic duct and common bile duct is betterdemonstrated on comparison CT. Potential signal void at this level is indeterminate.  2. Acute pancreatitis with pancreatic inflammation and peripancreatic fluid. No evidence of organized fluid collections. No evidence of pancreatic necrosis or pancreatic ductal interruption.  3. No evidence of biliary obstruction.  4. No variant pancreatic ductal anatomy.   Electronically Signed   By: Genevive Bi M.D.   On: 09/16/2013 11:10    Microbiology: No results found for this or any previous visit (from the past 240 hour(s)).   Labs: Basic Metabolic Panel:  Recent Labs Lab 09/14/13 1717 09/15/13  8119 09/16/13 0525 09/17/13 0608 09/18/13 0530  NA 138 137 138 136 136  K 4.7 4.4 3.4* 3.6 3.4*  CL 100 103 104 101 100  CO2 22 24 24 23 26   GLUCOSE 171* 130* 89 102* 86  BUN 15 11 7 8 9   CREATININE 0.72 0.67 0.58 0.58 0.57  CALCIUM 9.8 8.9 8.0* 8.4 8.3*   Liver Function Tests:  Recent Labs Lab 09/14/13 1717 09/15/13 0443 09/16/13 0525 09/17/13 0608 09/18/13 0530  AST 16 15 14 20 29   ALT 12 10 9 14  32  ALKPHOS 60 55 48 88 124*  BILITOT 0.6 0.6 0.6 0.6 0.5  PROT 7.5 7.0 5.9* 6.2 6.0  ALBUMIN 4.1 3.7 2.9* 2.9* 2.6*    Recent Labs Lab 09/14/13 1717 09/15/13 0443 09/16/13 0525  09/17/13 0608 09/18/13 0530  LIPASE 2831* 2312* 1002* 213* 52   No results found for this basename: AMMONIA,  in the last 168 hours CBC:  Recent Labs Lab 09/14/13 1717 09/15/13 0443  WBC 9.8 11.0*  NEUTROABS 8.3*  --   HGB 14.7 13.6  HCT 43.3 40.3  MCV 84.1 84.5  PLT 273 276   Cardiac Enzymes: No results found for this basename: CKTOTAL, CKMB, CKMBINDEX, TROPONINI,  in the last 168 hours BNP: BNP (last 3 results) No results found for this basename: PROBNP,  in the last 8760 hours CBG:  Recent Labs Lab 09/17/13 0654 09/17/13 1207 09/17/13 1751 09/18/13 0022 09/18/13 0603  GLUCAP 103* 81 90 92 79       Signed:  Mariely Mahr A  [facility] 09/18/2013, 11:58 AM

## 2013-09-18 NOTE — Progress Notes (Signed)
09/18/13 Patient to be discharged home today. IV site removed and discharge instructions reviewed with patient.

## 2013-09-18 NOTE — Progress Notes (Signed)
[facility] Progress Note  Subjective: Patient without abdominal pain  Objective: Vital signs in last 24 hours: Temp:  [98.1 F (36.7 C)-99.5 F (37.5 C)] 98.9 F (37.2 C) (11/02 0605) Pulse Rate:  [60-80] 64 (11/02 0605) Resp:  [18-29] 18 (11/02 0605) BP: (137-170)/(67-107) 139/78 mmHg (11/02 0605) SpO2:  [96 %-100 %] 96 % (11/02 0605) Weight change:    PE: Abdomen soft nontender  Lab Results: Results for orders placed during the hospital encounter of 09/14/13 (from the past 24 hour(s))  GLUCOSE, CAPILLARY     Status: Abnormal   Collection Time    09/17/13  6:54 AM      Result Value Range   Glucose-Capillary 103 (*) 70 - 99 mg/dL  GLUCOSE, CAPILLARY     Status: None   Collection Time    09/17/13 12:07 PM      Result Value Range   Glucose-Capillary 81  70 - 99 mg/dL  GLUCOSE, CAPILLARY     Status: None   Collection Time    09/17/13  5:51 PM      Result Value Range   Glucose-Capillary 90  70 - 99 mg/dL  GLUCOSE, CAPILLARY     Status: None   Collection Time    09/18/13 12:22 AM      Result Value Range   Glucose-Capillary 92  70 - 99 mg/dL   Comment 1 Notify RN    GLUCOSE, CAPILLARY     Status: None   Collection Time    09/18/13  6:03 AM      Result Value Range   Glucose-Capillary 79  70 - 99 mg/dL   Comment 1 Notify RN      Studies/Results: Mr 3d Recon At Scanner  09/16/2013   CLINICAL DATA:  Previous history of pancreatitis. Persistent abdominal pain. Current acute pancreatitis with potential obstructing calculus seen on comparison CT.  EXAM: MR ABDOMEN WO/W CM MRCP; MR 3D RECON AT SCANNER  TECHNIQUE: Multiplanar multisequence MR imaging of the abdomen was performed, including heavily T2-weighted images of the biliary and pancreatic ducts. Three-dimensional MR images were rendered by post processing of the original MR data.  CONTRAST:  18mL MULTIHANCE GADOBENATE DIMEGLUMINE 529 MG/ML IV SOLN  COMPARISON:  CT 09/14/2013  FINDINGS: There is no  intrahepatic biliary duct dilatation. Patient status post cholecystectomy. The common bile duct is upper limits of normal at 6 mm. The pancreatic duct is mildly dilated to 4 mm. The calculus seen on comparison CT at the level of the confluence of the pancreatic duct and common bile duct is not well appreciated. There are no discrete filling defects within the common bile duct or pancreatic duct. There is a potential signal void at the ampulla which could represent a calculus but si indeterminate. (image 49 series 10 and image 17, series 4.). No variant pancreatic ductal anatomy.  There is evidence of acute pancreatitis with edema of the pancreatic head and body as well as peripancreatic fluid. The pancreatic duct is uninterrupted. There are no organized fluid collections. The pancreatic head and body enhance without evidence of necrosis. Small fluid extends along the anterior perirenal space.  The spleen is normal. Adrenal glands and kidneys are normal. There is a simple cyst within the left kidney.  IMPRESSION: 1. The small calculus at the confluence of the pancreatic duct and common bile duct is betterdemonstrated on comparison CT. Potential signal void at this level is indeterminate.  2. Acute pancreatitis with pancreatic inflammation and peripancreatic fluid. No evidence of organized  fluid collections. No evidence of pancreatic necrosis or pancreatic ductal interruption.  3. No evidence of biliary obstruction.  4. No variant pancreatic ductal anatomy.   Electronically Signed   By: Genevive Bi M.D.   On: 09/16/2013 11:10   Dg C-arm 1-60 Min-no Report  09/17/2013   CLINICAL DATA: failed ERCP   C-ARM 1-60 MINUTES  Fluoroscopy was utilized by the requesting physician.  No radiographic  interpretation.    Mr Abd W/wo Cm/mrcp  09/16/2013   CLINICAL DATA:  Previous history of pancreatitis. Persistent abdominal pain. Current acute pancreatitis with potential obstructing calculus seen on comparison CT.  EXAM:  MR ABDOMEN WO/W CM MRCP; MR 3D RECON AT SCANNER  TECHNIQUE: Multiplanar multisequence MR imaging of the abdomen was performed, including heavily T2-weighted images of the biliary and pancreatic ducts. Three-dimensional MR images were rendered by post processing of the original MR data.  CONTRAST:  18mL MULTIHANCE GADOBENATE DIMEGLUMINE 529 MG/ML IV SOLN  COMPARISON:  CT 09/14/2013  FINDINGS: There is no intrahepatic biliary duct dilatation. Patient status post cholecystectomy. The common bile duct is upper limits of normal at 6 mm. The pancreatic duct is mildly dilated to 4 mm. The calculus seen on comparison CT at the level of the confluence of the pancreatic duct and common bile duct is not well appreciated. There are no discrete filling defects within the common bile duct or pancreatic duct. There is a potential signal void at the ampulla which could represent a calculus but si indeterminate. (image 49 series 10 and image 17, series 4.). No variant pancreatic ductal anatomy.  There is evidence of acute pancreatitis with edema of the pancreatic head and body as well as peripancreatic fluid. The pancreatic duct is uninterrupted. There are no organized fluid collections. The pancreatic head and body enhance without evidence of necrosis. Small fluid extends along the anterior perirenal space.  The spleen is normal. Adrenal glands and kidneys are normal. There is a simple cyst within the left kidney.  IMPRESSION: 1. The small calculus at the confluence of the pancreatic duct and common bile duct is betterdemonstrated on comparison CT. Potential signal void at this level is indeterminate.  2. Acute pancreatitis with pancreatic inflammation and peripancreatic fluid. No evidence of organized fluid collections. No evidence of pancreatic necrosis or pancreatic ductal interruption.  3. No evidence of biliary obstruction.  4. No variant pancreatic ductal anatomy.   Electronically Signed   By: Genevive Bi M.D.   On:  09/16/2013 11:10      Assessment: 1 probable distal common bile duct unsuccessful ERCP yesterday  Plan: Discharged home. Our office will contact her tomorrow to set up a repeat ERCP attempt under general anesthesia this week.    Felecia Stanfill C 09/18/2013, 6:23 AM

## 2013-09-19 ENCOUNTER — Encounter (HOSPITAL_COMMUNITY): Payer: Self-pay | Admitting: Pharmacy Technician

## 2013-09-19 ENCOUNTER — Encounter (HOSPITAL_COMMUNITY): Payer: Self-pay | Admitting: *Deleted

## 2013-09-19 NOTE — Care Management Note (Signed)
    Page 1 of 1   09/19/2013     3:21:11 PM   CARE MANAGEMENT NOTE 09/19/2013  Patient:  Julie Cross, Julie Cross   Account Number:  [id number]  Date Initiated:  09/19/2013  Documentation initiated by:  Letha Cape  Subjective/Objective Assessment:   dx pancreatitis, cbd stone  admit- lives with spouse.     Action/Plan:   Anticipated DC Date:  09/18/2013   Anticipated DC Plan:  HOME/SELF CARE      DC Planning Services  CM consult      Choice offered to / List presented to:             Status of service:  Completed, signed off Medicare Important Message given?   (If response is "NO", the following Medicare IM given date fields will be blank) Date Medicare IM given:   Date Additional Medicare IM given:    Discharge Disposition:  HOME/SELF CARE  Per UR Regulation:  Reviewed for med. necessity/level of care/duration of stay  If discussed at Long Length of Stay Meetings, dates discussed:    Comments:

## 2013-09-19 NOTE — Op Note (Signed)
Medications wasted on patient, 6mg  Versed, Fentanyl. Unable to access patient and mediations in Pyxis.   Co-signed by Nada Libman, RN.

## 2013-09-20 ENCOUNTER — Encounter (HOSPITAL_COMMUNITY): Payer: Self-pay | Admitting: Gastroenterology

## 2013-09-23 ENCOUNTER — Encounter (HOSPITAL_COMMUNITY): Payer: Self-pay | Admitting: *Deleted

## 2013-09-23 ENCOUNTER — Encounter (HOSPITAL_COMMUNITY)
Admission: RE | Disposition: A | Discharge status: Home / Self Care | Payer: Self-pay | Source: Ambulatory Visit | Attending: Gastroenterology

## 2013-09-23 ENCOUNTER — Ambulatory Visit (HOSPITAL_COMMUNITY)
Admission: RE | Admit: 2013-09-23 | Discharge: 2013-09-23 | Disposition: A | Discharge status: Home / Self Care | Payer: Managed Care, Other (non HMO) | Source: Ambulatory Visit | Attending: Gastroenterology | Admitting: Gastroenterology

## 2013-09-23 ENCOUNTER — Ambulatory Visit (HOSPITAL_COMMUNITY): Payer: Managed Care, Other (non HMO)

## 2013-09-23 ENCOUNTER — Encounter (HOSPITAL_COMMUNITY): Payer: Managed Care, Other (non HMO) | Admitting: Anesthesiology

## 2013-09-23 ENCOUNTER — Ambulatory Visit (HOSPITAL_COMMUNITY): Payer: Managed Care, Other (non HMO) | Admitting: Anesthesiology

## 2013-09-23 ENCOUNTER — Other Ambulatory Visit: Payer: Self-pay | Admitting: Gastroenterology

## 2013-09-23 DIAGNOSIS — E039 Hypothyroidism, unspecified: Secondary | ICD-10-CM | POA: Insufficient documentation

## 2013-09-23 DIAGNOSIS — K8050 Calculus of bile duct without cholangitis or cholecystitis without obstruction: Secondary | ICD-10-CM | POA: Insufficient documentation

## 2013-09-23 HISTORY — PX: ERCP: SHX5425

## 2013-09-23 HISTORY — DX: Other specified postprocedural states: Z98.890

## 2013-09-23 HISTORY — DX: Nausea with vomiting, unspecified: R11.2

## 2013-09-23 HISTORY — DX: Hypothyroidism, unspecified: E03.9

## 2013-09-23 LAB — CBC
MCH: 27.6 pg (ref 26.0–34.0)
Platelets: 392 10*3/uL (ref 150–400)
RBC: 5 MIL/uL (ref 3.87–5.11)
RDW: 12.7 % (ref 11.5–15.5)
WBC: 7.2 10*3/uL (ref 4.0–10.5)

## 2013-09-23 LAB — COMPREHENSIVE METABOLIC PANEL
ALT: 29 U/L (ref 0–35)
AST: 26 U/L (ref 0–37)
Albumin: 3.6 g/dL (ref 3.5–5.2)
Alkaline Phosphatase: 87 U/L (ref 39–117)
CO2: 29 mEq/L (ref 19–32)
Calcium: 9.5 mg/dL (ref 8.4–10.5)
Chloride: 99 mEq/L (ref 96–112)
Creatinine, Ser: 0.61 mg/dL (ref 0.50–1.10)
GFR calc non Af Amer: >90 mL/min (ref >90)
Potassium: 3.5 mEq/L (ref 3.5–5.1)
Sodium: 139 mEq/L (ref 135–145)
Total Bilirubin: 0.3 mg/dL (ref 0.3–1.2)
Total Protein: 7.7 g/dL (ref 6.0–8.3)

## 2013-09-23 LAB — TYPE AND SCREEN
ABO/RH(D): B POS
Antibody Screen: NEGATIVE

## 2013-09-23 SURGERY — ERCP, WITH INTERVENTION IF INDICATED
Anesthesia: General

## 2013-09-23 MED ORDER — ONDANSETRON HCL 4 MG/2ML IJ SOLN
INTRAMUSCULAR | Status: DC | PRN
  Administered 2013-09-23: 4 mg via INTRAVENOUS

## 2013-09-23 MED ORDER — PROPOFOL 10 MG/ML IV BOLUS: INTRAVENOUS | Status: DC | PRN

## 2013-09-23 MED ORDER — ROCURONIUM BROMIDE 100 MG/10ML IV SOLN: INTRAVENOUS | Status: DC | PRN

## 2013-09-23 MED ORDER — PROPOFOL 10 MG/ML IV BOLUS
INTRAVENOUS | Status: DC | PRN
  Administered 2013-09-23: 180 mg via INTRAVENOUS

## 2013-09-23 MED ORDER — DEXAMETHASONE SODIUM PHOSPHATE 10 MG/ML IJ SOLN
INTRAMUSCULAR | Status: DC | PRN
  Administered 2013-09-23: 5 mg via INTRAVENOUS

## 2013-09-23 MED ORDER — PROMETHAZINE HCL 25 MG/ML IJ SOLN: 6.2500 mg | INTRAMUSCULAR | Status: DC | PRN

## 2013-09-23 MED ORDER — ROCURONIUM BROMIDE 100 MG/10ML IV SOLN
INTRAVENOUS | Status: DC | PRN
  Administered 2013-09-23: 5 mg via INTRAVENOUS

## 2013-09-23 MED ORDER — LIDOCAINE HCL (CARDIAC) 20 MG/ML IV SOLN
INTRAVENOUS | Status: DC | PRN
  Administered 2013-09-23: 50 mg via INTRAVENOUS

## 2013-09-23 MED ORDER — CIPROFLOXACIN IN D5W 400 MG/200ML IV SOLN
400.0000 mg | Freq: Once | INTRAVENOUS | Status: AC
  Administered 2013-09-23: 400 mg via INTRAVENOUS

## 2013-09-23 MED ORDER — METOCLOPRAMIDE HCL 5 MG/ML IJ SOLN
INTRAMUSCULAR | Status: DC | PRN
  Administered 2013-09-23: 10 mg via INTRAVENOUS

## 2013-09-23 MED ORDER — PANTOPRAZOLE SODIUM 40 MG IV SOLR
40.0000 mg | INTRAVENOUS | Status: AC
  Administered 2013-09-23: 40 mg via INTRAVENOUS
  Filled 2013-09-23: qty 40

## 2013-09-23 MED ORDER — SUCCINYLCHOLINE CHLORIDE 20 MG/ML IJ SOLN
INTRAMUSCULAR | Status: DC | PRN
  Administered 2013-09-23: 100 mg via INTRAVENOUS

## 2013-09-23 MED ORDER — CIPROFLOXACIN IN D5W 400 MG/200ML IV SOLN
INTRAVENOUS | Status: AC
  Filled 2013-09-23: qty 200

## 2013-09-23 MED ORDER — SODIUM CHLORIDE 0.9 % IV SOLN
INTRAVENOUS | Status: DC | PRN
  Administered 2013-09-23: 13:00:00

## 2013-09-23 MED ORDER — LACTATED RINGERS IV SOLN
INTRAVENOUS | Status: DC
  Administered 2013-09-23: 1000 mL via INTRAVENOUS

## 2013-09-23 MED ORDER — FENTANYL CITRATE 0.05 MG/ML IJ SOLN
INTRAMUSCULAR | Status: DC | PRN
  Administered 2013-09-23: 50 ug via INTRAVENOUS

## 2013-09-23 MED ORDER — SUCCINYLCHOLINE CHLORIDE 20 MG/ML IJ SOLN: INTRAMUSCULAR | Status: DC | PRN

## 2013-09-23 MED ORDER — HYDROMORPHONE HCL PF 1 MG/ML IJ SOLN: 0.2500 mg | INTRAMUSCULAR | Status: DC | PRN

## 2013-09-23 MED ORDER — PHENYLEPHRINE HCL 10 MG/ML IJ SOLN
INTRAMUSCULAR | Status: DC | PRN
  Administered 2013-09-23: 40 ug via INTRAVENOUS

## 2013-09-23 MED ORDER — MIDAZOLAM HCL 5 MG/5ML IJ SOLN
INTRAMUSCULAR | Status: DC | PRN
  Administered 2013-09-23: 2 mg via INTRAVENOUS

## 2013-09-23 NOTE — Anesthesia Preprocedure Evaluation (Signed)
Anesthesia Evaluation  Patient identified by MRN, date of birth, ID band Patient awake  General Assessment Comment:Pancreatitis  Reviewed: Allergy & Precautions, H&P , NPO status , Patient's Chart, lab work & pertinent test results  History of Anesthesia Complications (+) PONV  Airway Mallampati: II TM Distance: >3 FB Neck ROM: Full    Dental no notable dental hx.    Pulmonary neg pulmonary ROS,  breath sounds clear to auscultation  Pulmonary exam normal       Cardiovascular negative cardio ROS  Rhythm:Regular Rate:Normal     Neuro/Psych negative neurological ROS  negative psych ROS   GI/Hepatic negative GI ROS, Neg liver ROS,   Endo/Other  Hypothyroidism   Renal/GU negative Renal ROS  negative genitourinary   Musculoskeletal negative musculoskeletal ROS (+)   Abdominal   Peds negative pediatric ROS (+)  Hematology negative hematology ROS (+)   Anesthesia Other Findings   Reproductive/Obstetrics negative OB ROS                           Anesthesia Physical Anesthesia Plan  ASA: II  Anesthesia Plan: General   Post-op Pain Management:    Induction: Intravenous  Airway Management Planned: Oral ETT  Additional Equipment:   Intra-op Plan:   Post-operative Plan: Extubation in OR  Informed Consent: I have reviewed the patients History and Physical, chart, labs and discussed the procedure including the risks, benefits and alternatives for the proposed anesthesia with the patient or authorized representative who has indicated his/her understanding and acceptance.   Dental advisory given  Plan Discussed with: CRNA and Surgeon  Anesthesia Plan Comments:         Anesthesia Quick Evaluation

## 2013-09-23 NOTE — Op Note (Signed)
[facility] [address] [place], [address]   ERCP PROCEDURE REPORT  PATIENT: Julie Cross, Julie Cross  MR# :[id number] BIRTHDATE: Jun 25, 1956  GENDER: Female ENDOSCOPIST: Vida Rigger, MD REFERRED BY: Lupita Raider, M.D. PROCEDURE DATE:  09/23/2013 PROCEDURE:   ERCP with sphincterotomy/papillotomy, ERCP with removal of calculus/calculi with balloon pull-through , and ERCP with PD stent placement and then removal at end of procedure ASA CLASS:    1 INDICATIONS: CBD stone MEDICATIONS: general anesthesia TOPICAL ANESTHETIC:  no  DESCRIPTION OF PROCEDURE:   After the risks benefits and alternatives of the procedure were thoroughly explained, informed consent was obtained.  The Pentax ERCP [id number]  endoscope was introduced through the mouth and advanced to the second portion of the duodenum .and normal-appearing ampulla was brought into view and using the triple-lumen sphincterotome loaded with a JAG Jagwire after a few attempts at cannulation the wire went up the pancreas and we placed a 5 French 5 cm single pigtail pancreatic stent and then were able to cannulate with the sphincterotome in the customary fashion next o the stent and the wire was advanced into the intrahepatics and a cholangiogram was done which did not show an obvious stone but we proceeded with a sphincterotomy in the customary fashion until we had adequate biliary drainage and could get the fully bowed sphincterotome easily in and out of the duct and then we proceeded with 3-12 mm balloon pull-throughs using the adjustable balloon which passed readily through the patent sphincterotomy site without any resistance or heme but no stone was seen. We then proceeded with an occlusion cholangiogram which did not reveal any residual stone and the balloon was pulled through the patent sphincterotomy site and we elected to stop the procedure at this point and the wire and balloon were removed and since there was  no dye injected into the pancreas and we only advance the wire one time into the pancreas when we placed the stent we elected to remove the stent at this time grabbing it with the biopsy forceps and removing it through the scope and the stent was recovered and the scope was removed and the patient tolerated the procedure well there was no obvious immediate complication          COMPLICATIONS:  none  ENDOSCOPIC IMPRESSION:1 normal-appearing ampulla 2. Pancreatic stent placed to assist with cannulation and remove at the end of the procedure 3. No obvious CBD stones seen status post sphincterotomy and multiple balloon pull-throughs 4. Negative occlusion cholangiogram at the end of the procedure were slightly sluggish drainage probably secondary to ampulla edema  RECOMMENDATIONS:observe for delayed complications if none GI followup when necessary and slowly advance diet later today     _______________________________ eSigned:  Vida Rigger, MD 09/23/2013 1:30 PM   XB:[id number] Clelia Croft, MD  PATIENT NAME:  Julie Cross, Julie Cross MR#: [id number]

## 2013-09-23 NOTE — Anesthesia Postprocedure Evaluation (Signed)
  Anesthesia Post-op Note  Patient: Julie Cross  Procedure(s) Performed: Procedure(s) (LRB): ENDOSCOPIC RETROGRADE CHOLANGIOPANCREATOGRAPHY (ERCP) (N/A)  Patient Location: PACU  Anesthesia Type: General  Level of Consciousness: awake and alert   Airway and Oxygen Therapy: Patient Spontanous Breathing  Post-op Pain: mild  Post-op Assessment: Post-op Vital signs reviewed, Patient's Cardiovascular Status Stable, Respiratory Function Stable, Patent Airway and No signs of Nausea or vomiting  Last Vitals:  Filed Vitals:   09/23/13 1335  BP: 136/43  Pulse:   Temp: 36.6 C  Resp: 16    Post-op Vital Signs: stable   Complications: No apparent anesthesia complications

## 2013-09-23 NOTE — Transfer of Care (Signed)
Immediate Anesthesia Transfer of Care Note  Patient: Julie Cross  Procedure(s) Performed: Procedure(s): ENDOSCOPIC RETROGRADE CHOLANGIOPANCREATOGRAPHY (ERCP) (N/A)  Patient Location: PACU and Endoscopy Unit  Anesthesia Type:General  Level of Consciousness: awake, alert , oriented and patient cooperative  Airway & Oxygen Therapy: Patient Spontanous Breathing and Patient connected to face mask oxygen  Post-op Assessment: Report given to PACU RN, Post -op Vital signs reviewed and stable and Patient moving all extremities  Post vital signs: Reviewed and stable  Complications: No apparent anesthesia complications

## 2013-09-23 NOTE — Addendum Note (Signed)
Addended byVida Rigger on: 09/23/2013 12:54 PM   Modules accepted: Orders

## 2013-09-23 NOTE — Progress Notes (Signed)
Julie Cross 12:16 PM  Subjective: Patient currently doing well without any GI symptoms and we reviewed her history with her and her husband and discussed with my partner Dr. Madilyn Fireman and rediscussed the procedure including the risks and success rate  Objective: Vital signs stable afebrile exam please see pre-assessment evaluation  Assessment: Probable CBD stone  Plan: Okay to proceed with ERCP and anesthesia today  [facility]

## 2013-09-26 ENCOUNTER — Encounter (HOSPITAL_COMMUNITY): Payer: Self-pay | Admitting: Gastroenterology

## 2014-03-17 ENCOUNTER — Other Ambulatory Visit (HOSPITAL_COMMUNITY)
Admission: RE | Admit: 2014-03-17 | Discharge: 2014-03-17 | Disposition: A | Discharge status: Home / Self Care | Payer: Managed Care, Other (non HMO) | Source: Ambulatory Visit | Attending: Family Medicine | Admitting: Family Medicine

## 2014-03-17 ENCOUNTER — Other Ambulatory Visit: Payer: Self-pay | Admitting: Family Medicine

## 2014-03-17 DIAGNOSIS — Z124 Encounter for screening for malignant neoplasm of cervix: Secondary | ICD-10-CM | POA: Insufficient documentation

## 2014-03-17 DIAGNOSIS — Z1151 Encounter for screening for human papillomavirus (HPV): Secondary | ICD-10-CM | POA: Insufficient documentation

## 2015-02-19 ENCOUNTER — Other Ambulatory Visit: Payer: Self-pay

## 2015-02-19 DIAGNOSIS — Z1231 Encounter for screening mammogram for malignant neoplasm of breast: Secondary | ICD-10-CM

## 2015-04-04 ENCOUNTER — Ambulatory Visit
Admission: RE | Admit: 2015-04-04 | Discharge: 2015-04-04 | Disposition: A | Discharge status: Home / Self Care | Payer: Managed Care, Other (non HMO) | Source: Ambulatory Visit

## 2015-04-04 DIAGNOSIS — Z1231 Encounter for screening mammogram for malignant neoplasm of breast: Secondary | ICD-10-CM

## 2015-05-02 ENCOUNTER — Other Ambulatory Visit: Payer: Self-pay | Admitting: Family Medicine

## 2015-05-02 DIAGNOSIS — E049 Nontoxic goiter, unspecified: Secondary | ICD-10-CM

## 2015-05-09 ENCOUNTER — Ambulatory Visit
Admission: RE | Admit: 2015-05-09 | Discharge: 2015-05-09 | Disposition: A | Discharge status: Home / Self Care | Payer: Managed Care, Other (non HMO) | Source: Ambulatory Visit | Attending: Family Medicine | Admitting: Family Medicine

## 2015-05-09 DIAGNOSIS — E049 Nontoxic goiter, unspecified: Secondary | ICD-10-CM

## 2015-05-18 ENCOUNTER — Other Ambulatory Visit: Payer: Self-pay | Admitting: Family Medicine

## 2015-05-18 DIAGNOSIS — E041 Nontoxic single thyroid nodule: Secondary | ICD-10-CM

## 2015-05-23 ENCOUNTER — Ambulatory Visit
Admission: RE | Admit: 2015-05-23 | Discharge: 2015-05-23 | Disposition: A | Discharge status: Home / Self Care | Payer: Managed Care, Other (non HMO) | Source: Ambulatory Visit | Attending: Family Medicine | Admitting: Family Medicine

## 2015-05-23 ENCOUNTER — Other Ambulatory Visit (HOSPITAL_COMMUNITY)
Admission: RE | Admit: 2015-05-23 | Discharge: 2015-05-23 | Disposition: A | Discharge status: Home / Self Care | Payer: Managed Care, Other (non HMO) | Source: Ambulatory Visit | Attending: Interventional Radiology | Admitting: Interventional Radiology

## 2015-05-23 DIAGNOSIS — E041 Nontoxic single thyroid nodule: Secondary | ICD-10-CM | POA: Insufficient documentation

## 2015-07-02 ENCOUNTER — Ambulatory Visit (INDEPENDENT_AMBULATORY_CARE_PROVIDER_SITE_OTHER): Payer: Managed Care, Other (non HMO) | Admitting: Endocrinology

## 2015-07-02 ENCOUNTER — Encounter: Payer: Self-pay | Admitting: Endocrinology

## 2015-07-02 VITALS — BP 128/87 | HR 95 | Temp 98.0°F | Ht 61.5 in | Wt 190.0 lb

## 2015-07-02 DIAGNOSIS — F411 Generalized anxiety disorder: Secondary | ICD-10-CM | POA: Diagnosis not present

## 2015-07-02 DIAGNOSIS — K219 Gastro-esophageal reflux disease without esophagitis: Secondary | ICD-10-CM

## 2015-07-02 DIAGNOSIS — G43909 Migraine, unspecified, not intractable, without status migrainosus: Secondary | ICD-10-CM | POA: Insufficient documentation

## 2015-07-02 DIAGNOSIS — E785 Hyperlipidemia, unspecified: Secondary | ICD-10-CM | POA: Diagnosis not present

## 2015-07-02 DIAGNOSIS — E041 Nontoxic single thyroid nodule: Secondary | ICD-10-CM

## 2015-07-02 DIAGNOSIS — G43809 Other migraine, not intractable, without status migrainosus: Secondary | ICD-10-CM | POA: Diagnosis not present

## 2015-07-02 NOTE — Patient Instructions (Addendum)
If I was you, I would have the thyroid lump removed.   A second best option is to redo the ultrasound in 6 months. I would be happy to see you back here whenever you feel it is needed.  Please call or message [place] if you have any other questions.

## 2015-07-02 NOTE — Progress Notes (Signed)
Subjective:    Patient ID: Julie Cross, female    DOB: 1956-03-15, 59 y.o.   MRN: [id number]  HPI Pt reports hypothyroidism was dx'ed in approx 1998.  she has been on prescribed thyroid hormone therapy since then.  she has never taken kelp or any other type of non-prescribed thyroid product.  she has never had thyroid surgery, or XRT to the neck.  she has never been on amiodarone or lithium.  In 2016, she was noted on routine visit to have a right thyroid nodule, and had bx.  She has slight swelling at the anterior neck, and assoc fatigue.   Past Medical History  Diagnosis Date  . Pancreatitis   . Thyroid disease   . Migraine   . PONV (postoperative nausea and vomiting)   . Hypothyroidism     Past Surgical History  Procedure Laterality Date  . Cholecystectomy  2007  . Hernia repair  1999  . Breast surgery  2006    biopsy-right-benign  . Tubal ligation  1980's  . Ercp N/A 09/17/2013    Procedure: ENDOSCOPIC RETROGRADE CHOLANGIOPANCREATOGRAPHY (ERCP);  Surgeon: Missy Sabins, MD;  Location: [facility] ENDOSCOPY;  Service: Endoscopy;  Laterality: N/A;  . Ercp N/A 09/23/2013    Procedure: ENDOSCOPIC RETROGRADE CHOLANGIOPANCREATOGRAPHY (ERCP);  Surgeon: Jeryl Columbia, MD;  Location: [facility] ENDOSCOPY;  Service: Endoscopy;  Laterality: N/A;    Social History   Social History  . Marital Status: Married    Spouse Name: N/A  . Number of Children: N/A  . Years of Education: N/A   Occupational History  . Not on file.   Social History Main Topics  . Smoking status: Never Smoker   . Smokeless tobacco: Not on file  . Alcohol Use: Yes     Comment: occ  . Drug Use: No  . Sexual Activity: Not on file   Other Topics Concern  . Not on file   Social History Narrative    Current Outpatient Prescriptions on File Prior to Visit  Medication Sig Dispense Refill  . isometheptene-acetaminophen-dichloralphenazone (MIDRIN) 65-325-100 MG capsule Take 1 capsule by mouth 4 (four) times daily as needed for  migraine. Maximum 5 capsules in 12 hours for migraine headaches, 8 capsules in 24 hours for tension headaches.     No current facility-administered medications on file prior to visit.    Allergies  Allergen Reactions  . Gabapentin Other (See Comments)    Suicidal ideations    Family History  Problem Relation Age of Onset  . Breast cancer Mother   . Lung cancer Father   . Hodgkin's lymphoma Brother   . Stroke Other   . CAD Other   . Thyroid disease Son   . Thyroid disease Sister     BP 128/87 mmHg  Pulse 95  Temp(Src) 98 F (36.7 C) (Oral)  Ht 5' 1.5" (1.562 m)  Wt 190 lb (86.183 kg)  BMI 35.32 kg/m2  SpO2 94%    Review of Systems denies depression, hair loss, muscle cramps, sob, weight gain, numbness, blurry vision, myalgias, dry skin, rhinorrhea, easy bruising, and syncope.  She has intermittent constipation, easy bruising, and cold intolerance.     Objective:   Physical Exam VS: see vs page GEN: no distress HEAD: head: no deformity eyes: no periorbital swelling, no proptosis external nose and ears are normal mouth: no lesion seen NECK: right thyroid nodule is easily palpable CHEST WALL: no deformity LUNGS:  Clear to auscultation.   CV: reg rate and rhythm,  no murmur ABD: abdomen is soft, nontender.  no hepatosplenomegaly.  not distended.  no hernia MUSCULOSKELETAL: muscle bulk and strength are grossly normal.  no obvious joint swelling.  gait is normal and steady EXTEMITIES: no deformity.  no edema PULSES: no carotid bruit NEURO:  cn 2-12 grossly intact.   readily moves all 4's.  sensation is intact to touch on all 4's SKIN:  Normal texture and temperature.  No rash or suspicious lesion is visible.   NODES:  None palpable at the neck PSYCH: alert, well-oriented.  Does not appear anxious nor depressed.   i have reviewed outside records (office visit of 05/02/15): slight thyroid enlaargement was noted and [place] ordered  outside test results are  reviewed: TSH=2.22 i also reviewed cytol report.    Radiol: thyroid US (05/09/15): bethesda class 3.     Assessment & Plan:  Thyroid nodule, new.  Euthyroid.  i advised resection.    Patient is advised the following: Patient Instructions  If I was you, I would have the thyroid lump removed.   A second best option is to redo the ultrasound in 6 months. I would be happy to see you back here whenever you feel it is needed.  Please call or message [place] if you have any other questions.

## 2015-07-03 DIAGNOSIS — E041 Nontoxic single thyroid nodule: Secondary | ICD-10-CM | POA: Insufficient documentation

## 2015-07-10 ENCOUNTER — Ambulatory Visit: Payer: Self-pay | Admitting: Surgery

## 2015-09-18 NOTE — Patient Instructions (Addendum)
[facility]  09/18/2015   Your procedure is scheduled on:   09-25-2015 Tuesday  Enter through [facility]  Entrance and follow signs to [facility]. Arrive at     0530   AM.  (Limit 1 person with you).  Call this number if you have problems the morning of surgery: [phone]  Or Presurgical Testing [phone].   For Living Will and/or Health Care Power Attorney Forms: please provide copy for your medical record,may bring AM of surgery(Forms should be already notarized -we do not provide this service).(09-19-15 Yes, information provided with previous visit).      Do not eat or drink past 12 midnight.    Take these medicines the morning of surgery with A SIP OF WATER-   (DO NOT TAKE ANY DIABETIC MEDS AM OF SURGERY) : Levothyroxine.   Do not wear jewelry, make-up or nail polish.  Do not wear deodorant, lotions, powders, or perfumes.   Do not shave legs and under arms- 48 hours(2 days) prior to first CHG shower.(Shaving face and neck okay.)  Do not bring valuables to the hospital.(Hospital is not responsible for lost valuables).  Contacts, dentures or removable bridgework, body piercing, hair pins may not be worn into surgery.  Leave suitcase in the car. After surgery it may be brought to your room.  For patients admitted to the hospital, checkout time is 11:00 AM the day of discharge.(Restricted visitors-Any Persons displaying flu-like symptoms or illness).    Patients discharged the day of surgery will not be allowed to drive home. Must have responsible person with you x 24 hours once discharged.  Name and phone number of your driver: Tangy Drozdowski [phone] cell     Please read over the following fact sheets that you were given:  CHG(Chlorhexidine Gluconate 4% Surgical Soap) use.         [facility] - Preparing for Surgery Before surgery, you can play an important role.  Because skin is not sterile, your skin needs to be as free of germs as  possible.  You can reduce the number of germs on your skin by washing with CHG (chlorahexidine gluconate) soap before surgery.  CHG is an antiseptic cleaner which kills germs and bonds with the skin to continue killing germs even after washing. Please DO NOT use if you have an allergy to CHG or antibacterial soaps.  If your skin becomes reddened/irritated stop using the CHG and inform your nurse when you arrive at Short Stay. Do not shave (including legs and underarms) for at least 48 hours prior to the first CHG shower.  You may shave your face/neck. Please follow these instructions carefully:  1.  Shower with CHG Soap the night before surgery and the  morning of Surgery.  2.  If you choose to wash your hair, wash your hair first as usual with your  normal  shampoo.  3.  After you shampoo, rinse your hair and body thoroughly to remove the  shampoo.                           4.  Use CHG as you would any other liquid soap.  You can apply chg directly  to the skin and wash                       Gently with a scrungie or clean washcloth.  5.  Apply the CHG Soap to your  body ONLY FROM THE NECK DOWN.   Do not use on face/ open                           Wound or open sores. Avoid contact with eyes, ears mouth and genitals (private parts).                       Wash face,  Genitals (private parts) with your normal soap.             6.  Wash thoroughly, paying special attention to the area where your surgery  will be performed.  7.  Thoroughly rinse your body with warm water from the neck down.  8.  DO NOT shower/wash with your normal soap after using and rinsing off  the CHG Soap.                9.  Pat yourself dry with a clean towel.            10.  Wear clean pajamas.            11.  Place clean sheets on your bed the night of your first shower and do not  sleep with pets. Day of Surgery : Do not apply any lotions/deodorants the morning of surgery.  Please wear clean clothes to the hospital/surgery  center.  FAILURE TO FOLLOW THESE INSTRUCTIONS MAY RESULT IN THE CANCELLATION OF YOUR SURGERY PATIENT SIGNATURE_________________________________  NURSE SIGNATURE__________________________________  ________________________________________________________________________

## 2015-09-19 ENCOUNTER — Encounter (HOSPITAL_COMMUNITY)
Admission: RE | Admit: 2015-09-19 | Discharge: 2015-09-19 | Disposition: A | Discharge status: Home / Self Care | Payer: Managed Care, Other (non HMO) | Source: Ambulatory Visit | Attending: Surgery | Admitting: Surgery

## 2015-09-19 ENCOUNTER — Encounter (HOSPITAL_COMMUNITY): Payer: Self-pay

## 2015-09-19 ENCOUNTER — Ambulatory Visit (HOSPITAL_COMMUNITY)
Admission: RE | Admit: 2015-09-19 | Discharge: 2015-09-19 | Disposition: A | Discharge status: Home / Self Care | Payer: Managed Care, Other (non HMO) | Source: Ambulatory Visit | Attending: Anesthesiology | Admitting: Anesthesiology

## 2015-09-19 DIAGNOSIS — K449 Diaphragmatic hernia without obstruction or gangrene: Secondary | ICD-10-CM | POA: Diagnosis not present

## 2015-09-19 DIAGNOSIS — I517 Cardiomegaly: Secondary | ICD-10-CM | POA: Diagnosis not present

## 2015-09-19 DIAGNOSIS — Z01810 Encounter for preprocedural cardiovascular examination: Secondary | ICD-10-CM

## 2015-09-19 DIAGNOSIS — D497 Neoplasm of unspecified behavior of endocrine glands and other parts of nervous system: Secondary | ICD-10-CM | POA: Diagnosis not present

## 2015-09-19 DIAGNOSIS — Z01812 Encounter for preprocedural laboratory examination: Secondary | ICD-10-CM | POA: Diagnosis not present

## 2015-09-19 DIAGNOSIS — Z01818 Encounter for other preprocedural examination: Secondary | ICD-10-CM | POA: Diagnosis present

## 2015-09-19 HISTORY — DX: Gastro-esophageal reflux disease without esophagitis: K21.9

## 2015-09-19 LAB — CBC
HEMATOCRIT: 43.5 % (ref 36.0–46.0)
HEMOGLOBIN: 13.8 g/dL (ref 12.0–15.0)
MCH: 27.9 pg (ref 26.0–34.0)
MCHC: 31.7 g/dL (ref 30.0–36.0)
MCV: 88.1 fL (ref 78.0–100.0)
Platelets: 287 10*3/uL (ref 150–400)
RBC: 4.94 MIL/uL (ref 3.87–5.11)
RDW: 13.3 % (ref 11.5–15.5)
WBC: 5.1 10*3/uL (ref 4.0–10.5)

## 2015-09-19 NOTE — Pre-Procedure Instructions (Signed)
09-19-15 CXR done today.

## 2015-09-24 ENCOUNTER — Encounter (HOSPITAL_COMMUNITY): Payer: Self-pay | Admitting: Surgery

## 2015-09-24 DIAGNOSIS — D440 Neoplasm of uncertain behavior of thyroid gland: Secondary | ICD-10-CM | POA: Diagnosis present

## 2015-09-24 NOTE — H&P (Signed)
General Surgery [facility]  Sylvan Cheese DOB: 11-Jun-1956 Married / Language: English / Race: White Female  History of Present Illness  The patient is a 59 year old female who presents with a thyroid nodule. Patient referred by Dr. Mayra Neer for evaluation of thyroid nodule with atypia. Patient was noted on physician examination have a right-sided thyroid nodule. Patient has a long-standing history of hypothyroidism and currently takes levothyroxine 125 g daily. Patient underwent a thyroid ultrasound showing a normal-sized thyroid gland. Left thyroid lobe was heterogeneous but without nodules. 3 nodules were present in the right thyroid lobe with the largest measuring 3.1 x 2.4 x 2.9 cm. Fine-needle aspiration biopsy was obtained on May 23, 2015. This showed atypia consistent with a follicular lesion of undetermined significance, Bethesda category III, with nuclear enlargement, nuclear overlap, and nuclear grooves. Patient has no prior history of head or neck surgery. There is a family history of hypothyroidism and the patient's mother and a history of Hashimoto's thyroiditis and the patient's son. There is no family history of other endocrine neoplasms. Patient denies tremors or palpitations. She denies compressive symptoms. She did have moderate discomfort following fine-needle aspiration biopsy. Patient has recently noted mild hoarseness of voice which has been worse after the fine-needle aspiration biopsy was completed.  Other Problems Anxiety Disorder Cholelithiasis Gastroesophageal Reflux Disease Migraine Headache Pancreatitis Thyroid Disease Ventral Hernia Repair  Past Surgical History Breast Biopsy Right. Colon Polyp Removal - Colonoscopy Gallbladder Surgery - Laparoscopic Pancreas Surgery  Ventral / Umbilical Hernia Surgery Right.  Diagnostic Studies History Colonoscopy within last year Mammogram within last year Pap  Smear 1-5 years ago  Allergies Gabapentin *CHEMICALS*  Medication History Levothyroxine Sodium (125MCG Tablet, Oral) Active. Isometheptene-Dichloral-APAP (65-100-325MG  Capsule, Oral) Active. Medications Reconciled  Social History Alcohol use Occasional alcohol use. Caffeine use Coffee, Tea. Illicit drug use Prefer to discuss with provider. Tobacco use Former smoker.  Family History Alcohol Abuse Brother, Father, Mother. Arthritis Mother. Breast Cancer Mother. Cancer Mother. Colon Polyps Father. Depression Mother. Diabetes Mellitus Mother. Hypertension Mother. Respiratory Condition Father, Mother. Thyroid problems Sister.  Pregnancy / Birth History Age at menarche 7 years. Age of menopause [age] Contraceptive History Oral contraceptives. Gravida 2 Irregular periods Maternal age 65-25 Para 2  Review of Systems General Present- Fatigue and Night Sweats. Not Present- Appetite Loss, Chills, Fever, Weight Gain and Weight Loss. Skin Not Present- Change in Wart/Mole, Dryness, Hives, Jaundice, New Lesions, Non-Healing Wounds, Rash and Ulcer. HEENT Present- Hoarseness, Seasonal Allergies and Wears glasses/contact lenses. Not Present- Earache, Hearing Loss, Nose Bleed, Oral Ulcers, Ringing in the Ears, Sinus Pain, Sore Throat, Visual Disturbances and Yellow Eyes. Respiratory Not Present- Bloody sputum, Chronic Cough, Difficulty Breathing, Snoring and Wheezing. Breast Not Present- Breast Mass, Breast Pain, Nipple Discharge and Skin Changes. Cardiovascular Not Present- Chest Pain, Difficulty Breathing Lying Down, Leg Cramps, Palpitations, Rapid Heart Rate, Shortness of Breath and Swelling of Extremities. Gastrointestinal Present- Indigestion. Not Present- Abdominal Pain, Bloating, Bloody Stool, Change in Bowel Habits, Chronic diarrhea, Constipation, Difficulty Swallowing, Excessive gas, Gets full quickly at meals, Hemorrhoids, Nausea, Rectal Pain and  Vomiting. Female Genitourinary Not Present- Frequency, Nocturia, Painful Urination, Pelvic Pain and Urgency. Musculoskeletal Not Present- Back Pain, Joint Pain, Joint Stiffness, Muscle Pain, Muscle Weakness and Swelling of Extremities. Neurological Not Present- Decreased Memory, Fainting, Headaches, Numbness, Seizures, Tingling, Tremor, Trouble walking and Weakness. Psychiatric Not Present- Anxiety, Bipolar, Change in Sleep Pattern, Depression, Fearful and Frequent crying. Endocrine Present- Cold Intolerance and Hot flashes. Not Present-  Excessive Hunger, Hair Changes, Heat Intolerance and New Diabetes. Hematology Not Present- Easy Bruising, Excessive bleeding, Gland problems, HIV and Persistent Infections.  Vitals Weight: 195 lb Height: 61.5in Body Surface Area: 1.96 m Body Mass Index: 36.25 kg/m  Temp.: 20F(Oral)  Pulse: 71 (Regular)  BP: 128/80 (Sitting, Left Arm, Standard)   Physical Exam  General - appears comfortable, no distress; not diaphorectic  HEENT - normocephalic; sclerae clear, gaze conjugate; mucous membranes moist, dentition good; voice normal  Neck - asymmetric on extension; no palpable anterior or posterior cervical adenopathy; right thyroid lobe with soft, smooth, approximately 3 cm mass notable mainly during swallowing; left lobe without palpable abnormality  Chest - clear bilaterally without rhonchi, rales, or wheeze  Cor - regular rhythm with normal rate; no significant murmur  Ext - non-tender without significant edema or lymphedema  Neuro - grossly intact; no tremor   Assessment & Plan  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 Biopsy of the dominant nodule shows cytologic atypia consistent with a follicular lesion of undetermined significance. Risk of malignancy in this nodule is at least 25%.  I had a lengthy discussion with the patient and  her husband regarding the significance of these findings and the options for management. We discussed right thyroid lobectomy, total thyroidectomy, repeat biopsy with AFFIRMA testing, or observation with repeat ultrasound in 6 months.  Patient and her husband would like to discuss these options at home. At this point they favor follow-up in 6 months with repeat thyroid ultrasound. We will schedule that at this time. Certainly if they would change her mind and like to proceed in a different fashion, they should notify my office.  The risks and benefits of the procedure have been discussed at length with the patient.  The patient understands the proposed procedure, potential alternative treatments, and the course of recovery to be expected.  All of the patient's questions have been answered at this time.  The patient wishes to proceed with surgery.  Earnstine Regal, MD, [facility] Office: [phone]

## 2015-09-25 ENCOUNTER — Observation Stay (HOSPITAL_COMMUNITY)
Admission: RE | Admit: 2015-09-25 | Discharge: 2015-09-26 | Disposition: A | Discharge status: Home / Self Care | Payer: Managed Care, Other (non HMO) | Source: Ambulatory Visit | Attending: Surgery | Admitting: Surgery

## 2015-09-25 ENCOUNTER — Ambulatory Visit (HOSPITAL_COMMUNITY): Payer: Managed Care, Other (non HMO) | Admitting: Certified Registered Nurse Anesthetist

## 2015-09-25 ENCOUNTER — Encounter (HOSPITAL_COMMUNITY): Payer: Self-pay | Admitting: *Deleted

## 2015-09-25 ENCOUNTER — Encounter (HOSPITAL_COMMUNITY)
Admission: RE | Disposition: A | Discharge status: Home / Self Care | Payer: Self-pay | Source: Ambulatory Visit | Attending: Surgery

## 2015-09-25 DIAGNOSIS — Z87891 Personal history of nicotine dependence: Secondary | ICD-10-CM | POA: Insufficient documentation

## 2015-09-25 DIAGNOSIS — Z888 Allergy status to other drugs, medicaments and biological substances status: Secondary | ICD-10-CM | POA: Insufficient documentation

## 2015-09-25 DIAGNOSIS — E039 Hypothyroidism, unspecified: Secondary | ICD-10-CM | POA: Insufficient documentation

## 2015-09-25 DIAGNOSIS — F419 Anxiety disorder, unspecified: Secondary | ICD-10-CM | POA: Diagnosis not present

## 2015-09-25 DIAGNOSIS — K219 Gastro-esophageal reflux disease without esophagitis: Secondary | ICD-10-CM | POA: Insufficient documentation

## 2015-09-25 DIAGNOSIS — E063 Autoimmune thyroiditis: Secondary | ICD-10-CM | POA: Insufficient documentation

## 2015-09-25 DIAGNOSIS — G43909 Migraine, unspecified, not intractable, without status migrainosus: Secondary | ICD-10-CM | POA: Insufficient documentation

## 2015-09-25 DIAGNOSIS — D34 Benign neoplasm of thyroid gland: Secondary | ICD-10-CM | POA: Diagnosis not present

## 2015-09-25 DIAGNOSIS — D440 Neoplasm of uncertain behavior of thyroid gland: Secondary | ICD-10-CM | POA: Diagnosis present

## 2015-09-25 DIAGNOSIS — D489 Neoplasm of uncertain behavior, unspecified: Secondary | ICD-10-CM | POA: Diagnosis present

## 2015-09-25 DIAGNOSIS — E041 Nontoxic single thyroid nodule: Secondary | ICD-10-CM | POA: Diagnosis present

## 2015-09-25 HISTORY — PX: THYROID LOBECTOMY: SHX420

## 2015-09-25 SURGERY — LOBECTOMY, THYROID
Anesthesia: General | Site: Neck | Laterality: Right

## 2015-09-25 MED ORDER — KCL IN DEXTROSE-NACL 20-5-0.45 MEQ/L-%-% IV SOLN
INTRAVENOUS | Status: DC
  Administered 2015-09-25 – 2015-09-26 (×2): via INTRAVENOUS
  Filled 2015-09-25 (×2): qty 1000

## 2015-09-25 MED ORDER — ONDANSETRON 4 MG PO TBDP: 4.0000 mg | ORAL_TABLET | Freq: Four times a day (QID) | ORAL | Status: DC | PRN

## 2015-09-25 MED ORDER — SUGAMMADEX SODIUM 200 MG/2ML IV SOLN
INTRAVENOUS | Status: AC
  Filled 2015-09-25: qty 2

## 2015-09-25 MED ORDER — HYDROMORPHONE HCL 1 MG/ML IJ SOLN
INTRAMUSCULAR | Status: AC
  Filled 2015-09-25: qty 1

## 2015-09-25 MED ORDER — ONDANSETRON HCL 4 MG/2ML IJ SOLN
INTRAMUSCULAR | Status: AC
  Filled 2015-09-25: qty 2

## 2015-09-25 MED ORDER — PROPOFOL 10 MG/ML IV BOLUS
INTRAVENOUS | Status: DC | PRN
  Administered 2015-09-25: 180 mg via INTRAVENOUS

## 2015-09-25 MED ORDER — ROCURONIUM BROMIDE 100 MG/10ML IV SOLN
INTRAVENOUS | Status: DC | PRN
  Administered 2015-09-25: 10 mg via INTRAVENOUS
  Administered 2015-09-25: 40 mg via INTRAVENOUS

## 2015-09-25 MED ORDER — MIDAZOLAM HCL 5 MG/5ML IJ SOLN
INTRAMUSCULAR | Status: DC | PRN
  Administered 2015-09-25: 2 mg via INTRAVENOUS

## 2015-09-25 MED ORDER — CEFAZOLIN SODIUM-DEXTROSE 2-3 GM-% IV SOLR
INTRAVENOUS | Status: AC
  Filled 2015-09-25: qty 50

## 2015-09-25 MED ORDER — CEFAZOLIN SODIUM-DEXTROSE 2-3 GM-% IV SOLR
2.0000 g | INTRAVENOUS | Status: AC
  Administered 2015-09-25: 2 g via INTRAVENOUS

## 2015-09-25 MED ORDER — LEVOTHYROXINE SODIUM 125 MCG PO TABS
125.0000 ug | ORAL_TABLET | Freq: Every day | ORAL | Status: DC
  Administered 2015-09-26: 125 ug via ORAL
  Filled 2015-09-25 (×2): qty 1

## 2015-09-25 MED ORDER — PROPOFOL 10 MG/ML IV BOLUS
INTRAVENOUS | Status: AC
  Filled 2015-09-25: qty 20

## 2015-09-25 MED ORDER — HYDROCODONE-ACETAMINOPHEN 5-325 MG PO TABS: 1.0000 | ORAL_TABLET | ORAL | Status: DC | PRN

## 2015-09-25 MED ORDER — ONDANSETRON HCL 4 MG/2ML IJ SOLN
4.0000 mg | Freq: Once | INTRAMUSCULAR | Status: AC | PRN
  Administered 2015-09-25: 4 mg via INTRAVENOUS

## 2015-09-25 MED ORDER — FENTANYL CITRATE (PF) 100 MCG/2ML IJ SOLN
INTRAMUSCULAR | Status: DC | PRN
  Administered 2015-09-25: 50 ug via INTRAVENOUS
  Administered 2015-09-25: 100 ug via INTRAVENOUS
  Administered 2015-09-25 (×2): 50 ug via INTRAVENOUS

## 2015-09-25 MED ORDER — DEXAMETHASONE SODIUM PHOSPHATE 10 MG/ML IJ SOLN
INTRAMUSCULAR | Status: AC
  Filled 2015-09-25: qty 1

## 2015-09-25 MED ORDER — ACETAMINOPHEN 650 MG RE SUPP: 650.0000 mg | Freq: Four times a day (QID) | RECTAL | Status: DC | PRN

## 2015-09-25 MED ORDER — LIDOCAINE HCL (CARDIAC) 20 MG/ML IV SOLN
INTRAVENOUS | Status: AC
  Filled 2015-09-25: qty 5

## 2015-09-25 MED ORDER — DEXAMETHASONE SODIUM PHOSPHATE 10 MG/ML IJ SOLN
INTRAMUSCULAR | Status: DC | PRN
  Administered 2015-09-25: 10 mg via INTRAVENOUS

## 2015-09-25 MED ORDER — ROCURONIUM BROMIDE 100 MG/10ML IV SOLN
INTRAVENOUS | Status: AC
  Filled 2015-09-25: qty 1

## 2015-09-25 MED ORDER — HYDROMORPHONE HCL 1 MG/ML IJ SOLN
0.2500 mg | INTRAMUSCULAR | Status: DC | PRN
  Administered 2015-09-25 (×2): 0.5 mg via INTRAVENOUS

## 2015-09-25 MED ORDER — ONDANSETRON HCL 4 MG/2ML IJ SOLN
INTRAMUSCULAR | Status: DC | PRN
  Administered 2015-09-25: 4 mg via INTRAVENOUS

## 2015-09-25 MED ORDER — ACETAMINOPHEN 325 MG PO TABS
650.0000 mg | ORAL_TABLET | Freq: Four times a day (QID) | ORAL | Status: DC | PRN
  Administered 2015-09-26: 650 mg via ORAL
  Filled 2015-09-25: qty 2

## 2015-09-25 MED ORDER — HYDROMORPHONE HCL 1 MG/ML IJ SOLN
INTRAMUSCULAR | Status: DC | PRN
  Administered 2015-09-25 (×2): 0.5 mg via INTRAVENOUS

## 2015-09-25 MED ORDER — SUGAMMADEX SODIUM 200 MG/2ML IV SOLN
INTRAVENOUS | Status: DC | PRN
  Administered 2015-09-25: 175 mg via INTRAVENOUS

## 2015-09-25 MED ORDER — ONDANSETRON HCL 4 MG/2ML IJ SOLN: 4.0000 mg | Freq: Four times a day (QID) | INTRAMUSCULAR | Status: DC | PRN

## 2015-09-25 MED ORDER — LACTATED RINGERS IV SOLN
INTRAVENOUS | Status: DC | PRN
  Administered 2015-09-25 (×2): via INTRAVENOUS

## 2015-09-25 MED ORDER — FENTANYL CITRATE (PF) 250 MCG/5ML IJ SOLN
INTRAMUSCULAR | Status: AC
  Filled 2015-09-25: qty 25

## 2015-09-25 MED ORDER — CLONAZEPAM 0.5 MG PO TABS: 0.5000 mg | ORAL_TABLET | Freq: Every day | ORAL | Status: DC | PRN

## 2015-09-25 MED ORDER — MIDAZOLAM HCL 2 MG/2ML IJ SOLN
INTRAMUSCULAR | Status: AC
  Filled 2015-09-25: qty 4

## 2015-09-25 MED ORDER — IBUPROFEN 200 MG PO TABS
600.0000 mg | ORAL_TABLET | Freq: Four times a day (QID) | ORAL | Status: DC | PRN
  Administered 2015-09-25: 600 mg via ORAL
  Filled 2015-09-25: qty 3

## 2015-09-25 MED ORDER — OXYCODONE-ACETAMINOPHEN 5-325 MG PO TABS: 1.0000 | ORAL_TABLET | ORAL | Status: DC | PRN

## 2015-09-25 MED ORDER — HYDROMORPHONE HCL 1 MG/ML IJ SOLN
1.0000 mg | INTRAMUSCULAR | Status: DC | PRN
  Administered 2015-09-25 (×2): 1 mg via INTRAVENOUS
  Filled 2015-09-25 (×2): qty 1

## 2015-09-25 MED ORDER — 0.9 % SODIUM CHLORIDE (POUR BTL) OPTIME
TOPICAL | Status: DC | PRN
  Administered 2015-09-25: 1000 mL

## 2015-09-25 MED ORDER — LACTATED RINGERS IV SOLN: INTRAVENOUS | Status: DC

## 2015-09-25 MED ORDER — MEPERIDINE HCL 50 MG/ML IJ SOLN: 6.2500 mg | INTRAMUSCULAR | Status: DC | PRN

## 2015-09-25 MED ORDER — LIDOCAINE HCL (CARDIAC) 20 MG/ML IV SOLN
INTRAVENOUS | Status: DC | PRN
  Administered 2015-09-25: 50 mg via INTRAVENOUS

## 2015-09-25 SURGICAL SUPPLY — 42 items
ATTRACTOMAT 16X20 MAGNETIC DRP (DRAPES) ×2 IMPLANT
BENZOIN TINCTURE PRP APPL 2/3 (GAUZE/BANDAGES/DRESSINGS) ×2 IMPLANT
BLADE HEX COATED 2.75 (ELECTRODE) ×2 IMPLANT
BLADE SURG 15 STRL LF DISP TIS (BLADE) ×1 IMPLANT
BLADE SURG 15 STRL SS (BLADE) ×1
CHLORAPREP W/TINT 26ML (MISCELLANEOUS) ×2 IMPLANT
CLIP TI MEDIUM 6 (CLIP) ×6 IMPLANT
CLIP TI WIDE RED SMALL 6 (CLIP) ×6 IMPLANT
COVER SURGICAL LIGHT HANDLE (MISCELLANEOUS) ×2 IMPLANT
DISSECTOR ROUND CHERRY 3/8 STR (MISCELLANEOUS) IMPLANT
DRAPE LAPAROTOMY T 98X78 PEDS (DRAPES) ×2 IMPLANT
DRESSING SURGICEL FIBRLLR 1X2 (HEMOSTASIS) ×1 IMPLANT
DRSG SURGICEL FIBRILLAR 1X2 (HEMOSTASIS) ×2
ELECT PENCIL ROCKER SW 15FT (MISCELLANEOUS) IMPLANT
ELECT REM PT RETURN 9FT ADLT (ELECTROSURGICAL) ×2
ELECTRODE REM PT RTRN 9FT ADLT (ELECTROSURGICAL) ×1 IMPLANT
GAUZE SPONGE 4X4 12PLY STRL (GAUZE/BANDAGES/DRESSINGS) IMPLANT
GAUZE SPONGE 4X4 16PLY XRAY LF (GAUZE/BANDAGES/DRESSINGS) ×4 IMPLANT
GLOVE BIOGEL PI IND STRL 6.5 (GLOVE) ×1 IMPLANT
GLOVE BIOGEL PI IND STRL 7.5 (GLOVE) ×1 IMPLANT
GLOVE BIOGEL PI INDICATOR 6.5 (GLOVE) ×1
GLOVE BIOGEL PI INDICATOR 7.5 (GLOVE) ×1
GLOVE SURG ORTHO 8.0 STRL STRW (GLOVE) ×2 IMPLANT
GLOVE SURG SS PI 6.5 STRL IVOR (GLOVE) ×2 IMPLANT
GLOVE SURG SS PI 7.5 STRL IVOR (GLOVE) ×2 IMPLANT
GOWN STRL NON-REIN LRG LVL3 (GOWN DISPOSABLE) ×2 IMPLANT
GOWN STRL REUS W/TWL XL LVL3 (GOWN DISPOSABLE) ×4 IMPLANT
KIT BASIN OR (CUSTOM PROCEDURE TRAY) ×2 IMPLANT
LIQUID BAND (GAUZE/BANDAGES/DRESSINGS) IMPLANT
PACK BASIC VI WITH GOWN DISP (CUSTOM PROCEDURE TRAY) ×2 IMPLANT
SHEARS HARMONIC 9CM CVD (BLADE) ×2 IMPLANT
STAPLER VISISTAT 35W (STAPLE) IMPLANT
STRIP CLOSURE SKIN 1/2X4 (GAUZE/BANDAGES/DRESSINGS) ×2 IMPLANT
SUT MNCRL AB 4-0 PS2 18 (SUTURE) ×2 IMPLANT
SUT SILK 2 0 (SUTURE)
SUT SILK 2-0 18XBRD TIE 12 (SUTURE) IMPLANT
SUT SILK 3 0 (SUTURE)
SUT SILK 3-0 18XBRD TIE 12 (SUTURE) IMPLANT
SUT VIC AB 3-0 SH 18 (SUTURE) ×4 IMPLANT
SYR BULB IRRIGATION 50ML (SYRINGE) IMPLANT
TOWEL OR 17X26 10 PK STRL BLUE (TOWEL DISPOSABLE) ×2 IMPLANT
YANKAUER SUCT BULB TIP 10FT TU (MISCELLANEOUS) ×2 IMPLANT

## 2015-09-25 NOTE — Transfer of Care (Signed)
Immediate Anesthesia Transfer of Care Note  Patient: Julie Cross  Procedure(s) Performed: Procedure(s): RIGHT THYROID LOBECTOMY (Right)  Patient Location: PACU  Anesthesia Type:General  Level of Consciousness: awake, alert  and oriented  Airway & Oxygen Therapy: Patient Spontanous Breathing and Patient connected to face mask oxygen  Post-op Assessment: Report given to RN and Post -op Vital signs reviewed and stable  Post vital signs: Reviewed and stable  Last Vitals:  Filed Vitals:   09/25/15 0855  BP: 160/84  Pulse: 78  Temp: 36.6 C  Resp: 20    Complications: No apparent anesthesia complications

## 2015-09-25 NOTE — Anesthesia Preprocedure Evaluation (Signed)
Anesthesia Evaluation  Patient identified by MRN, date of birth, ID band Patient awake    Reviewed: Allergy & Precautions, NPO status , Patient's Chart, lab work & pertinent test results  History of Anesthesia Complications (+) PONV  Airway Mallampati: I  TM Distance: >3 FB Neck ROM: Full    Dental   Pulmonary former smoker,    Pulmonary exam normal        Cardiovascular Normal cardiovascular exam     Neuro/Psych    GI/Hepatic GERD  Medicated and Controlled,  Endo/Other  Hypothyroidism   Renal/GU      Musculoskeletal   Abdominal   Peds  Hematology   Anesthesia Other Findings   Reproductive/Obstetrics                             Anesthesia Physical Anesthesia Plan  ASA: II  Anesthesia Plan: General   Post-op Pain Management:    Induction: Intravenous  Airway Management Planned: Oral ETT  Additional Equipment:   Intra-op Plan:   Post-operative Plan: Extubation in OR  Informed Consent: I have reviewed the patients History and Physical, chart, labs and discussed the procedure including the risks, benefits and alternatives for the proposed anesthesia with the patient or authorized representative who has indicated his/her understanding and acceptance.     Plan Discussed with: CRNA and Surgeon  Anesthesia Plan Comments:         Anesthesia Quick Evaluation

## 2015-09-25 NOTE — Anesthesia Procedure Notes (Signed)
Procedure Name: Intubation Date/Time: 09/25/2015 7:25 AM Performed by: Noralyn Pick D Pre-anesthesia Checklist: Patient identified, Emergency Drugs available, Suction available and Patient being monitored Patient Re-evaluated:Patient Re-evaluated prior to inductionOxygen Delivery Method: Circle System Utilized Preoxygenation: Pre-oxygenation with 100% oxygen Intubation Type: IV induction Ventilation: Mask ventilation without difficulty Laryngoscope Size: Mac and 3 Tube type: Oral Tube size: 7.5 mm Number of attempts: 1 Airway Equipment and Method: Stylet and Oral airway Placement Confirmation: ETT inserted through vocal cords under direct vision,  positive ETCO2 and breath sounds checked- equal and bilateral Secured at: 21 cm Tube secured with: Tape Dental Injury: Teeth and Oropharynx as per pre-operative assessment

## 2015-09-25 NOTE — Interval H&P Note (Signed)
History and Physical Interval Note:  09/25/2015 7:13 AM  Julie Cross  has presented today for surgery, with the diagnosis of THYROID NEOPLASM OF UNCERTAIN BEHAVIOR.  The various methods of treatment have been discussed with the patient and family. After consideration of risks, benefits and other options for treatment, the patient has consented to    Procedure(s): RIGHT THYROID LOBECTOMY (Right) as a surgical intervention .    The patient's history has been reviewed, patient examined, no change in status, stable for surgery.  I have reviewed the patient's chart and labs.  Questions were answered to the patient's satisfaction.    Earnstine Regal, MD, [facility] Office: [place]

## 2015-09-25 NOTE — Anesthesia Postprocedure Evaluation (Signed)
Anesthesia Post Note  Patient: Julie Cross  Procedure(s) Performed: Procedure(s) (LRB): RIGHT THYROID LOBECTOMY (Right)  Anesthesia type: general  Patient location: PACU  Post pain: Pain level controlled  Post assessment: Patient's Cardiovascular Status Stable  Last Vitals:  Filed Vitals:   09/25/15 0900  BP: 144/78  Pulse: 77  Temp:   Resp: 21    Post vital signs: Reviewed and stable  Level of consciousness: sedated  Complications: No apparent anesthesia complications

## 2015-09-25 NOTE — Op Note (Signed)
Procedure Note  Pre-operative Diagnosis:  Thyroid neoplasm of uncertain behavior, right lobe and isthmus  Post-operative Diagnosis:  same  Surgeon:  Earnstine Regal, MD, FACS  Assistant:  none   Procedure:  Right thyroid lobectomy and isthmusectomy  Anesthesia:  General  Estimated Blood Loss:  minimal  Drains: none         Specimen: thyroid lobe to pathology  Indications:  The patient is a 59 year old female who presents with a thyroid nodule. Patient referred by Dr. Mayra Neer for evaluation of thyroid nodule with atypia. Patient was noted on physician examination have a right-sided thyroid nodule. Patient has a long-standing history of hypothyroidism and currently takes levothyroxine 125 g daily. Patient underwent a thyroid ultrasound showing a normal-sized thyroid gland. Left thyroid lobe was heterogeneous but without nodules. 3 nodules were present in the right thyroid lobe with the largest measuring 3.1 x 2.4 x 2.9 cm. Fine-needle aspiration biopsy was obtained on May 23, 2015. This showed atypia consistent with a follicular lesion of undetermined significance, Bethesda category III, with nuclear enlargement, nuclear overlap, and nuclear grooves. Patient has no prior history of head or neck surgery. There is a family history of hypothyroidism and the patient's mother and a history of Hashimoto's thyroiditis and the patient's son. There is no family history of other endocrine neoplasms. Patient denies tremors or palpitations. She denies compressive symptoms. She did have moderate discomfort following fine-needle aspiration biopsy. Patient has recently noted mild hoarseness of voice which has been worse after the fine-needle aspiration biopsy was completed.  Procedure Details: Procedure was done in OR #2 at the [facility].  The patient was brought to the operating room and placed in a supine position on the operating room table.  Following administration of  general anesthesia, the patient was positioned and then prepped and draped in the usual aseptic fashion.  After ascertaining that an adequate level of anesthesia had been achieved, a Kocher incision was made with #15 blade.  Dissection was carried through subcutaneous tissues and platysma. Hemostasis was achieved with the electrocautery.  Skin flaps were elevated cephalad and caudad from the thyroid notch to the sternal notch.  A self-retaining retractor was placed for exposure.  Strap muscles were incised in the midline and dissection was begun on the right side.  Strap muscles were reflected laterally.  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  The lobe was gently mobilized with blunt dissection.  Superior pole vessels were dissected out and divided individually between small and medium Ligaclips with the Harmonic scalpel.  The thyroid lobe was rolled anteriorly.  Branches of the inferior thyroid artery were divided between small Ligaclips with the Harmonic scalpel.  Inferior venous tributaries were divided between Ligaclips.  Both the superior and inferior parathyroid glands were identified and preserved on their vascular pedicles.  The recurrent laryngeal nerve was identified and preserved along its course.  The ligament of Gwenlyn Found was released with the electrocautery and the gland was mobilized onto the anterior trachea. Isthmus was mobilized across the midline.  There was a small pyramidal lobe present which was resected with the thyroid isthmus.  The isthmus contained the dominant nodule which was mobilized off the underlying trachea.  The thyroid parenchyma was transected at the junction of the isthmus and contralateral thyroid lobe with the Harmonic scalpel between ligaclips.  The thyroid lobe and isthmus were submitted to pathology for review.  The neck was irrigated with warm saline.  Fibular was placed  throughout the operative field.  Strap muscles were reapproximated in the midline  with interrupted 3-0 Vicryl sutures.  Platysma was closed with interrupted 3-0 Vicryl sutures.  Skin was closed with a running 4-0 Monocryl subcuticular suture.  Wound was washed and dried and benzoin and steri-strips were applied.  Dry gauze dressing was placed.  The patient was awakened from anesthesia and brought to the recovery room.  The patient tolerated the procedure well.   Earnstine Regal, MD, [facility] Office: [phone]

## 2015-09-26 DIAGNOSIS — D34 Benign neoplasm of thyroid gland: Secondary | ICD-10-CM | POA: Diagnosis not present

## 2015-09-26 MED ORDER — OXYCODONE-ACETAMINOPHEN 5-325 MG PO TABS: 1.0000 | ORAL_TABLET | ORAL | Status: DC | PRN

## 2015-09-26 NOTE — Progress Notes (Signed)
Quick Note:  Please contact patient and notify of benign pathology results.  Julie Vancamp M. Tiyana Galla, MD, FACS [facility] Office: [phone]   ______ 

## 2015-09-26 NOTE — Progress Notes (Cosign Needed)
Discharge instructions provided to patient and family including follow up appointment, prescription medication, wound care, and when to call the doctor. Patient verbalizes understanding of discharge instructions.

## 2015-09-26 NOTE — Discharge Summary (Signed)
Physician Discharge Summary [facility]  Patient ID: Julie Cross MRN: [id number] DOB/AGE: [birth year] 60 y.o.  Admit date: 09/25/2015 Discharge date: 09/26/2015  Admission Diagnoses:  Thyroid neoplasm of uncertain behavior  Discharge Diagnoses:  Principal Problem:   Neoplasm of uncertain behavior of thyroid gland, right lobe Active Problems:   Thyroid nodule   Discharged Condition: good  Hospital Course: Patient was admitted for observation following thyroid surgery.  Post op course was uncomplicated.  Pain was well controlled.  Tolerated diet.  Patient was prepared for discharge home on POD#1.  Consults: None  Treatments: surgery: right thyroid lobectomy and isthmusectomy  Discharge Exam: Blood pressure 117/75, pulse 71, temperature 97.8 F (36.6 C), temperature source Oral, resp. rate 18, height 5\' 2"  (1.575 m), weight 86.183 kg (190 lb), SpO2 98 %. HEENT - clear Neck - wound dry and intact; minimal STS; voice normal Chest - clear bilaterally Cor - RRR  Disposition: Home  Discharge Instructions    Apply dressing    Complete by:  As directed   Apply light gauze dressing to wound before discharge home today.     Diet - low sodium heart healthy    Complete by:  As directed      Discharge instructions    Complete by:  As directed   During your recent anesthetic, you were given the medication sugammadex (Bridion). This medication interacts with hormonal forms of birth control (oral contraceptives and injected or implanted birth control) and may make them ineffective. IFYOU USE ANY HORMONAL FORM OF BIRTH CONTROL, YOU MUST USE AN ADDITIONAL BARRIER BIRTH CONTROL FOR METHOD FOR SEVEN DAYS after receiving sugammadex (Bridion) or there is a chance you could become pregnant.     Discharge instructions    Complete by:  As directed   [facility]  THYROID & PARATHYROID SURGERY:  POST-OP INSTRUCTIONS  Always review your discharge instruction  sheet from the facility where your surgery was performed.  A prescription for pain medication may be given to you upon discharge.  Take your pain medication as prescribed.  If narcotic pain medicine is not needed, then you may take acetaminophen (Tylenol) or ibuprofen (Advil) as needed.  Take your usually prescribed medications unless otherwise directed.  If you need a refill on your pain medication, please contact your pharmacy. They will contact our office to request authorization.  Prescriptions will not be processed by our office after 5 pm or on weekends.  Start with a light diet upon arrival home, such as soup and crackers or toast.  Be sure to drink plenty of fluids daily.  Resume your normal diet the day after surgery.  Most patients will experience some swelling and bruising on the chest and neck area.  Ice packs will help.  Swelling and bruising can take several days to resolve.   It is common to experience some constipation after surgery.  Increasing fluid intake and taking a stool softener will usually help or prevent this problem.  A mild laxative (Milk of Magnesia or Miralax) should be taken according to package directions if there has been no bowel movement after 48 hours.  You have steri-strips and a gauze dressing over your incision.  You may remove the gauze bandage on the second day after surgery, and you may shower at that time.  Leave your steri-strips (small skin tapes) in place directly over the incision.  These strips should remain on the skin for 5-7 days and then be removed.  You  may get them wet in the shower and pat them dry.  You may resume regular (light) daily activities beginning the next day - such as daily self-care, walking, climbing stairs - gradually increasing activities as tolerated.  You may have sexual intercourse when it is comfortable.  Refrain from any heavy lifting or straining until approved by your doctor.  You may drive when you no longer are taking  prescription pain medication, you can comfortably wear a seatbelt, and you can safely maneuver your car and apply brakes.  You should see your doctor in the office for a follow-up appointment approximately two to three weeks after your surgery.  Make sure that you call for this appointment within a day or two after you arrive home to insure a convenient appointment time.  WHEN TO CALL YOUR DOCTOR: -- Fever greater than 101.5 -- Inability to urinate -- Nausea and/or vomiting - persistent -- Extreme swelling or bruising -- Continued bleeding from incision -- Increased pain, redness, or drainage from the incision -- Difficulty swallowing or breathing -- Muscle cramping or spasms -- Numbness or tingling in hands or around lips  The clinic staff is available to answer your questions during regular business hours.  Please don't hesitate to call and ask to speak to one of the nurses if you have concerns.  Earnstine Regal, MD, [facility] Office: [phone]  Website: [email or web address]     Increase activity slowly    Complete by:  As directed      Remove dressing in 24 hours    Complete by:  As directed             Medication List    TAKE these medications        clonazePAM 0.5 MG tablet  Commonly known as:  KLONOPIN  Take 0.5 mg by mouth daily as needed for anxiety.     ESTRACE VAGINAL 0.1 MG/GM vaginal cream  Generic drug:  estradiol  USE 1 GRAM VAGINALLY TWICE A WEEK     isometheptene-acetaminophen-dichloralphenazone 65-325-100 MG capsule  Commonly known as:  MIDRIN  Take 1 capsule by mouth 4 (four) times daily as needed for migraine. Maximum 5 capsules in 12 hours for migraine headaches, 8 capsules in 24 hours for tension headaches.     levothyroxine 125 MCG tablet  Commonly known as:  SYNTHROID, LEVOTHROID  Take 125 mcg by mouth daily before breakfast.     oxyCODONE-acetaminophen 5-325 MG tablet   Commonly known as:  PERCOCET/ROXICET  Take 1-2 tablets by mouth every 4 (four) hours as needed for moderate pain or severe pain.         Earnstine Regal, MD, [facility] Office: [phone]   Signed: Earnstine Regal 09/26/2015, 7:37 AM

## 2016-01-02 ENCOUNTER — Ambulatory Visit: Payer: Managed Care, Other (non HMO) | Admitting: Endocrinology

## 2016-06-26 ENCOUNTER — Other Ambulatory Visit: Payer: Self-pay | Admitting: Family Medicine

## 2016-06-26 DIAGNOSIS — Z1231 Encounter for screening mammogram for malignant neoplasm of breast: Secondary | ICD-10-CM

## 2016-07-09 ENCOUNTER — Ambulatory Visit
Admission: RE | Admit: 2016-07-09 | Discharge: 2016-07-09 | Disposition: A | Discharge status: Home / Self Care | Payer: Managed Care, Other (non HMO) | Source: Ambulatory Visit | Attending: Family Medicine | Admitting: Family Medicine

## 2016-07-09 DIAGNOSIS — Z1231 Encounter for screening mammogram for malignant neoplasm of breast: Secondary | ICD-10-CM

## 2016-09-01 IMAGING — US US SOFT TISSUE HEAD/NECK
1 series · 13 of 25 positions shown · non-contrast
Comparison: None.

CLINICAL DATA: Thyroid enlargement

EXAM:
THYROID ULTRASOUND
TECHNIQUE: Ultrasound examination of the thyroid gland and adjacent soft
tissues was performed.

[Series 1: us soft tissue head/neck · 0.08mm/px · 13 of 38 slices shown]
[im 1/38]
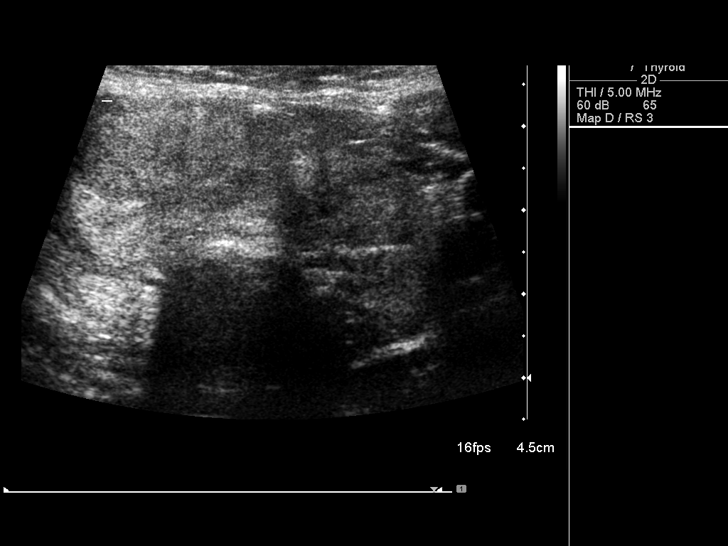
[im 4/38]
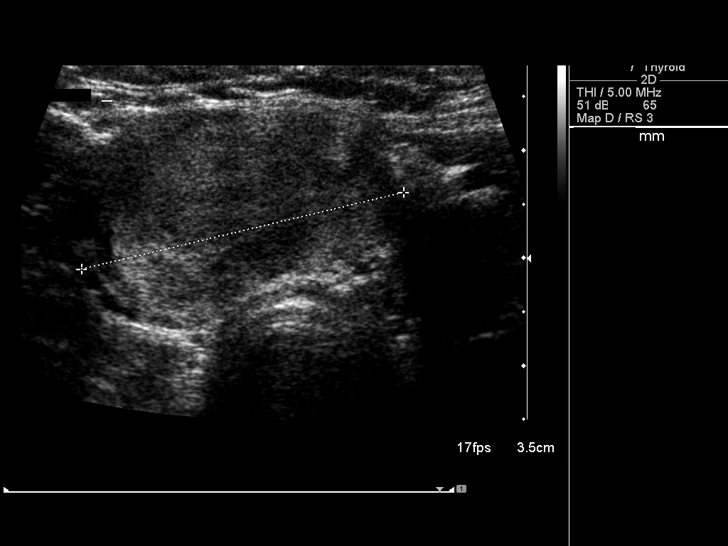
[im 7/38]
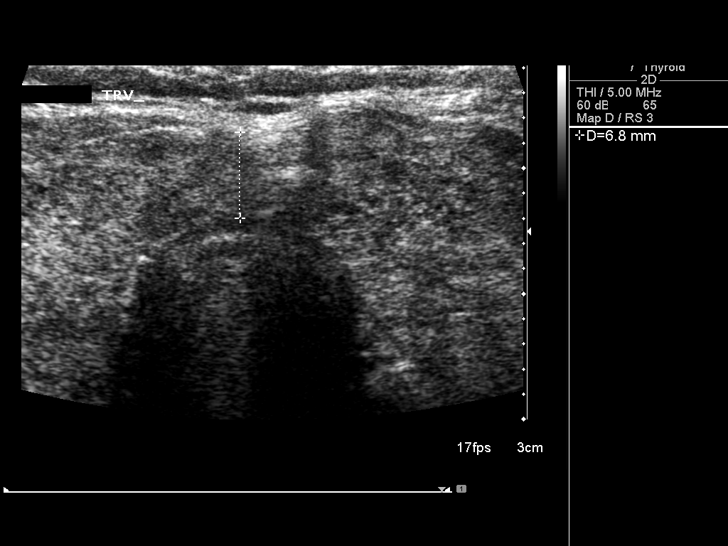
[im 10/38]
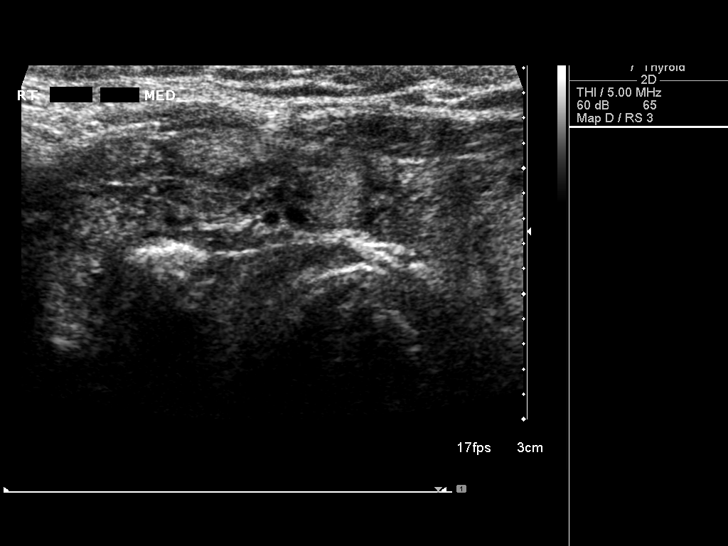
[im 13/38]
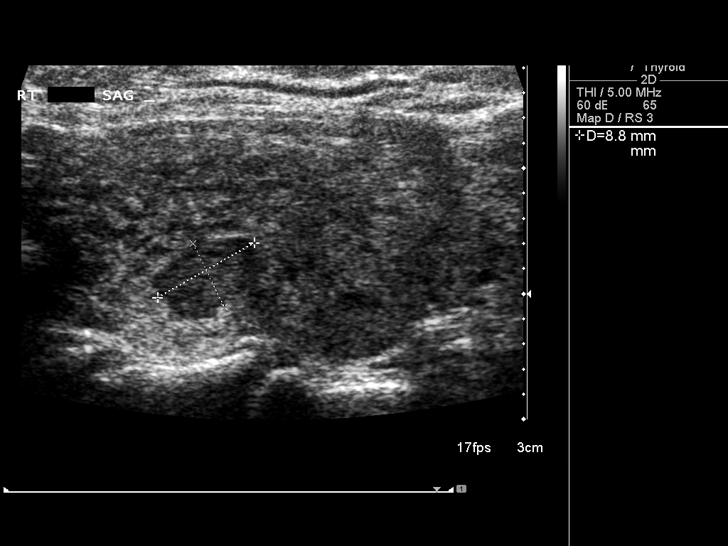
[im 16/38]
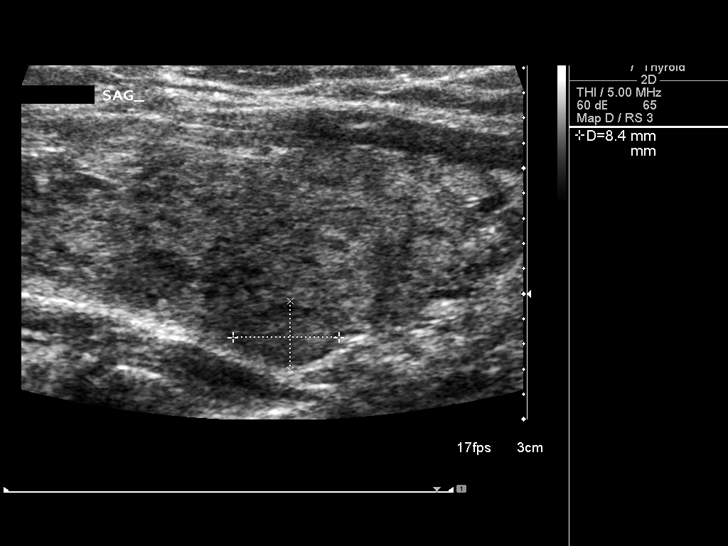
[im 19/38]
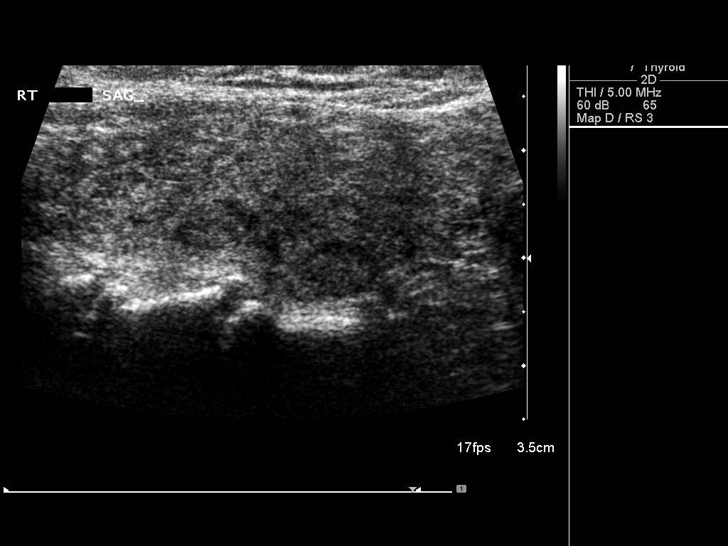
[im 22/38]
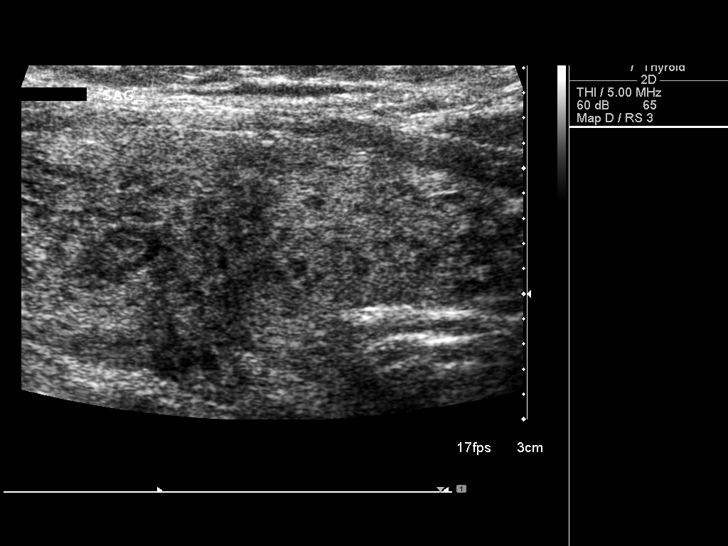
[im 25/38]
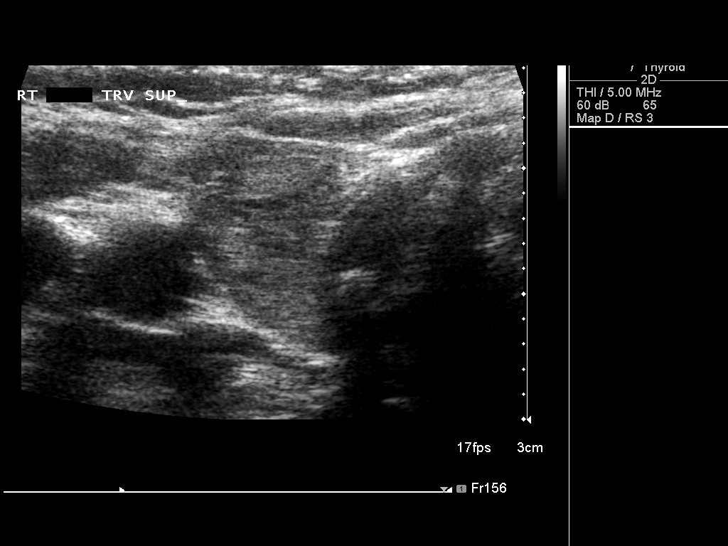
[im 28/38]
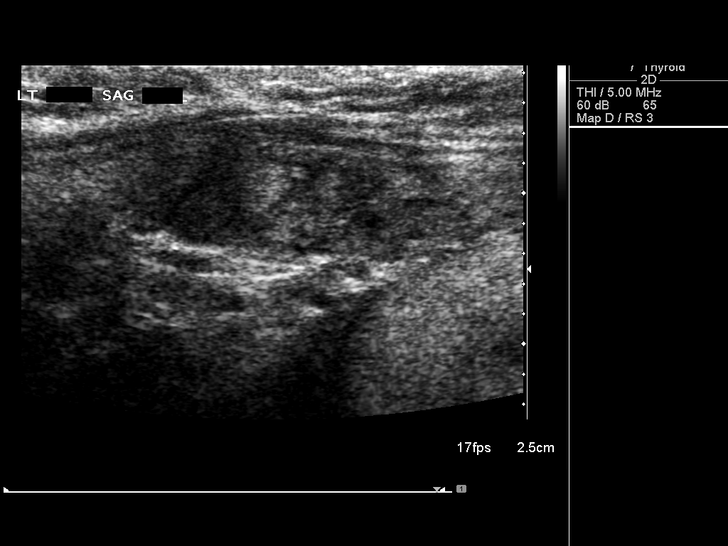
[im 31/38]
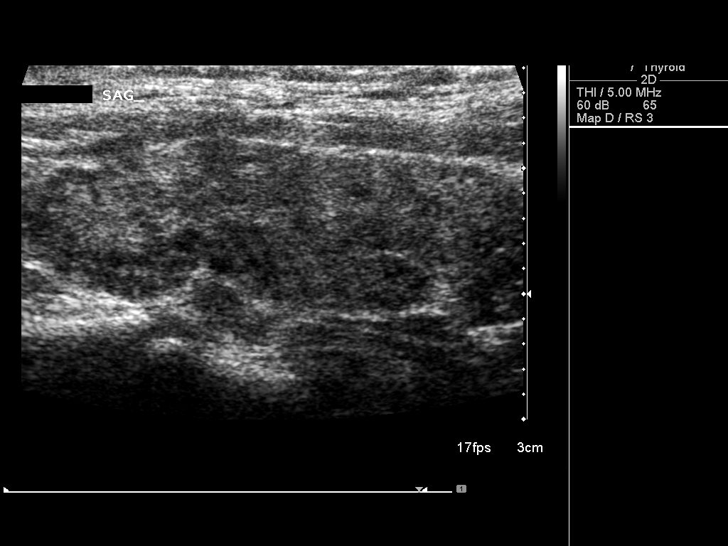
[im 34/38]
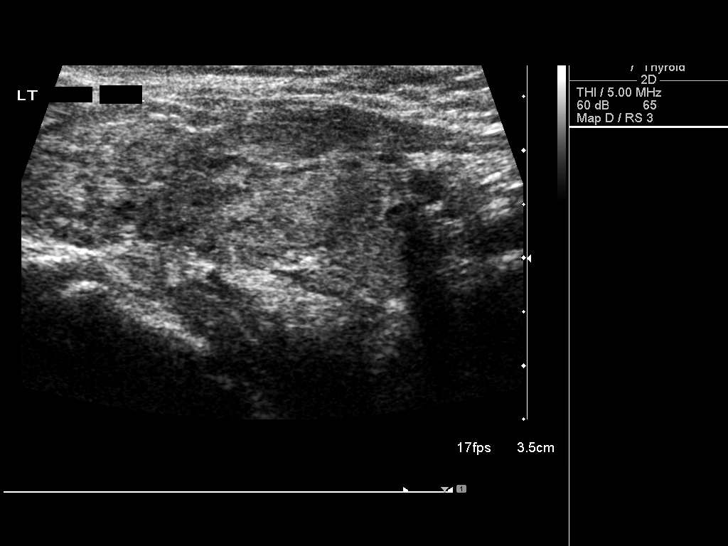
[im 38/38]
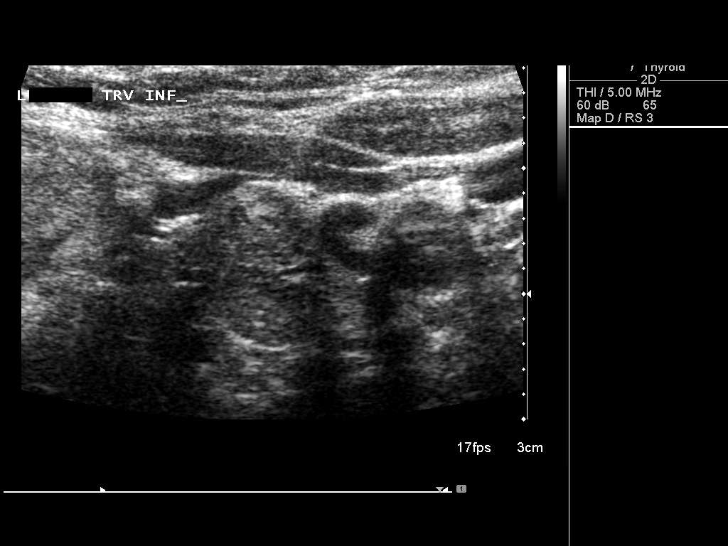

[13 of 25 positions shown; findings below may reference images not displayed]

FINDINGS: There is marked diffuse heterogeneity of thyroid parenchymal
echotexture.

Right thyroid lobe

Measurements: Normal in size measuring 5.4 x 2.1 x 2.1 cm.

Right, mid, posterior - 0.9 x 0.6 x 0.8 cm - hypoechoic, solid.

Right, mid, posterior - 0.8 x 0.6 x 0.7 cm - hypoechoic, solid

Left thyroid lobe

Measurements: Normal in size measuring 4.5 x 1.5 x 2.1 cm.

There is diffuse heterogeneity of the left lobe of the thyroid
without discrete nodule or mass.

Isthmus

Thickness: Enlarged measuring 0.7 cm in diameter.

*Right, inferior - 2.9 x 2.4 x 3.1 cm - mixed echogenic, solid

Lymphadenopathy

None visualized.
IMPRESSION: Findings suggestive of multi nodular goiter. The dominant
approximately 3.1 cm nodule / mass within the right inferior aspect
of the thyroid isthmus meets imaging criteria to recommend
percutaneous sampling as clinically indicated. This recommendation
follows the consensus statement: Management of Thyroid Nodules
Detected at US: Society of Radiologists in Ultrasound Consensus

## 2017-03-10 ENCOUNTER — Other Ambulatory Visit: Payer: Self-pay | Admitting: Family Medicine

## 2017-03-10 DIAGNOSIS — R221 Localized swelling, mass and lump, neck: Secondary | ICD-10-CM

## 2017-03-10 DIAGNOSIS — E041 Nontoxic single thyroid nodule: Secondary | ICD-10-CM

## 2017-03-16 ENCOUNTER — Ambulatory Visit
Admission: RE | Admit: 2017-03-16 | Discharge: 2017-03-16 | Disposition: A | Discharge status: Home / Self Care | Payer: Managed Care, Other (non HMO) | Source: Ambulatory Visit | Attending: Family Medicine | Admitting: Family Medicine

## 2017-03-16 DIAGNOSIS — R221 Localized swelling, mass and lump, neck: Secondary | ICD-10-CM

## 2017-03-16 DIAGNOSIS — E041 Nontoxic single thyroid nodule: Secondary | ICD-10-CM

## 2017-03-24 ENCOUNTER — Other Ambulatory Visit: Payer: Self-pay | Admitting: Family Medicine

## 2017-03-24 DIAGNOSIS — R221 Localized swelling, mass and lump, neck: Secondary | ICD-10-CM

## 2017-03-27 ENCOUNTER — Ambulatory Visit
Admission: RE | Admit: 2017-03-27 | Discharge: 2017-03-27 | Disposition: A | Discharge status: Home / Self Care | Payer: Managed Care, Other (non HMO) | Source: Ambulatory Visit | Attending: Family Medicine | Admitting: Family Medicine

## 2017-03-27 DIAGNOSIS — R221 Localized swelling, mass and lump, neck: Secondary | ICD-10-CM

## 2017-03-27 MED ORDER — IOPAMIDOL (ISOVUE-300) INJECTION 61%
75.0000 mL | Freq: Once | INTRAVENOUS | Status: AC | PRN
  Administered 2017-03-27: 75 mL via INTRAVENOUS

## 2017-06-17 ENCOUNTER — Other Ambulatory Visit: Payer: Self-pay | Admitting: Family Medicine

## 2017-06-17 DIAGNOSIS — Z1231 Encounter for screening mammogram for malignant neoplasm of breast: Secondary | ICD-10-CM

## 2017-07-15 ENCOUNTER — Ambulatory Visit
Admission: RE | Admit: 2017-07-15 | Discharge: 2017-07-15 | Disposition: A | Discharge status: Home / Self Care | Payer: Managed Care, Other (non HMO) | Source: Ambulatory Visit | Attending: Family Medicine | Admitting: Family Medicine

## 2017-07-15 DIAGNOSIS — Z1231 Encounter for screening mammogram for malignant neoplasm of breast: Secondary | ICD-10-CM

## 2017-09-07 ENCOUNTER — Other Ambulatory Visit: Payer: Self-pay | Admitting: Gastroenterology

## 2017-09-29 ENCOUNTER — Other Ambulatory Visit: Payer: Self-pay

## 2017-09-29 ENCOUNTER — Encounter (HOSPITAL_COMMUNITY): Payer: Self-pay | Admitting: Emergency Medicine

## 2017-10-06 ENCOUNTER — Other Ambulatory Visit: Payer: Self-pay | Admitting: Gastroenterology

## 2017-10-07 ENCOUNTER — Other Ambulatory Visit: Payer: Self-pay

## 2017-10-07 ENCOUNTER — Ambulatory Visit (HOSPITAL_COMMUNITY)
Admission: RE | Admit: 2017-10-07 | Discharge: 2017-10-07 | Disposition: A | Discharge status: Home / Self Care | Payer: Managed Care, Other (non HMO) | Source: Ambulatory Visit | Attending: Gastroenterology | Admitting: Gastroenterology

## 2017-10-07 ENCOUNTER — Encounter (HOSPITAL_COMMUNITY)
Admission: RE | Disposition: A | Discharge status: Home / Self Care | Payer: Self-pay | Source: Ambulatory Visit | Attending: Gastroenterology

## 2017-10-07 ENCOUNTER — Ambulatory Visit (HOSPITAL_COMMUNITY): Payer: Managed Care, Other (non HMO) | Admitting: Anesthesiology

## 2017-10-07 ENCOUNTER — Encounter (HOSPITAL_COMMUNITY): Payer: Self-pay | Admitting: Anesthesiology

## 2017-10-07 DIAGNOSIS — K219 Gastro-esophageal reflux disease without esophagitis: Secondary | ICD-10-CM | POA: Diagnosis not present

## 2017-10-07 DIAGNOSIS — E785 Hyperlipidemia, unspecified: Secondary | ICD-10-CM | POA: Diagnosis not present

## 2017-10-07 DIAGNOSIS — Z86010 Personal history of colonic polyps: Secondary | ICD-10-CM | POA: Diagnosis not present

## 2017-10-07 DIAGNOSIS — E669 Obesity, unspecified: Secondary | ICD-10-CM | POA: Insufficient documentation

## 2017-10-07 DIAGNOSIS — D120 Benign neoplasm of cecum: Secondary | ICD-10-CM | POA: Diagnosis not present

## 2017-10-07 DIAGNOSIS — E039 Hypothyroidism, unspecified: Secondary | ICD-10-CM | POA: Diagnosis not present

## 2017-10-07 DIAGNOSIS — Z6834 Body mass index (BMI) 34.0-34.9, adult: Secondary | ICD-10-CM | POA: Diagnosis not present

## 2017-10-07 DIAGNOSIS — F419 Anxiety disorder, unspecified: Secondary | ICD-10-CM | POA: Insufficient documentation

## 2017-10-07 DIAGNOSIS — Z79899 Other long term (current) drug therapy: Secondary | ICD-10-CM | POA: Insufficient documentation

## 2017-10-07 DIAGNOSIS — Z1211 Encounter for screening for malignant neoplasm of colon: Secondary | ICD-10-CM | POA: Diagnosis present

## 2017-10-07 DIAGNOSIS — Z888 Allergy status to other drugs, medicaments and biological substances status: Secondary | ICD-10-CM | POA: Insufficient documentation

## 2017-10-07 DIAGNOSIS — Z87891 Personal history of nicotine dependence: Secondary | ICD-10-CM | POA: Insufficient documentation

## 2017-10-07 HISTORY — PX: COLONOSCOPY WITH PROPOFOL: SHX5780

## 2017-10-07 SURGERY — COLONOSCOPY WITH PROPOFOL
Anesthesia: Monitor Anesthesia Care

## 2017-10-07 MED ORDER — LIDOCAINE 2% (20 MG/ML) 5 ML SYRINGE
INTRAMUSCULAR | Status: AC
  Filled 2017-10-07: qty 5

## 2017-10-07 MED ORDER — LACTATED RINGERS IV SOLN
INTRAVENOUS | Status: DC
  Administered 2017-10-07: 08:00:00 via INTRAVENOUS

## 2017-10-07 MED ORDER — PROPOFOL 10 MG/ML IV BOLUS
INTRAVENOUS | Status: DC | PRN
Start: 2017-10-07 — End: 2017-10-07
  Administered 2017-10-07 (×4): 20 mg via INTRAVENOUS

## 2017-10-07 MED ORDER — PROPOFOL 500 MG/50ML IV EMUL
INTRAVENOUS | Status: DC | PRN
Start: 2017-10-07 — End: 2017-10-07
  Administered 2017-10-07: 125 ug/kg/min via INTRAVENOUS

## 2017-10-07 MED ORDER — EPHEDRINE SULFATE-NACL 50-0.9 MG/10ML-% IV SOSY
PREFILLED_SYRINGE | INTRAVENOUS | Status: DC | PRN
  Administered 2017-10-07 (×2): 5 mg via INTRAVENOUS

## 2017-10-07 MED ORDER — EPHEDRINE 5 MG/ML INJ
INTRAVENOUS | Status: AC
Start: 2017-10-07 — End: ?
  Filled 2017-10-07: qty 10

## 2017-10-07 MED ORDER — PROPOFOL 10 MG/ML IV BOLUS
INTRAVENOUS | Status: AC
  Filled 2017-10-07: qty 60

## 2017-10-07 MED ORDER — SODIUM CHLORIDE 0.9 % IV SOLN: INTRAVENOUS | Status: DC

## 2017-10-07 SURGICAL SUPPLY — 21 items

## 2017-10-07 NOTE — Discharge Instructions (Signed)

## 2017-10-07 NOTE — Op Note (Signed)
[facility] Patient Name: Julie Cross Procedure Date: 10/07/2017 MRN: [id number] Attending MD: Arta Silence , MD Date of Birth: 1956/08/31 CSN: [id number] Age: 61 Admit Type: Outpatient Procedure:                Colonoscopy Indications:              Follow-up for history of adenomatous polyps in the                            colon Providers:                Arta Silence, MD, Elmer Ramp. Tilden Dome, RN, Elspeth Cho Tech., Technician, Danley Danker, CRNA Referring MD:              Medicines:                Monitored Anesthesia Care Complications:            No immediate complications. Estimated Blood Loss:     Estimated blood loss: none. Procedure:                Pre-Anesthesia Assessment:                           - Prior to the procedure, a History and Physical                            was performed, and patient medications and                            allergies were reviewed. The patient's tolerance of                            previous anesthesia was also reviewed. The risks                            and benefits of the procedure and the sedation                            options and risks were discussed with the patient.                            All questions were answered, and informed consent                            was obtained. Prior Anticoagulants: The patient has                            taken no previous anticoagulant or antiplatelet                            agents. ASA Grade Assessment: II - A patient with  mild systemic disease. After reviewing the risks                            and benefits, the patient was deemed in                            satisfactory condition to undergo the procedure.                           After obtaining informed consent, the colonoscope                            was passed under direct vision. Throughout the                            procedure, the  patient's blood pressure, pulse, and                            oxygen saturations were monitored continuously. The                            EC-[id number] ([id number]) scope was introduced through                            the anus and advanced to the the cecum, identified                            by appendiceal orifice and ileocecal valve. The                            ileocecal valve, appendiceal orifice, and rectum                            were photographed. The entire colon was examined.                            The colonoscopy was performed without difficulty.                            The patient tolerated the procedure well. The                            quality of the bowel preparation was fair. Scope In: 8:50:42 AM Scope Out: 9:23:03 AM Scope Withdrawal Time: 0 hours 25 minutes 45 seconds  Total Procedure Duration: 0 hours 32 minutes 21 seconds  Findings:      The perianal and digital rectal examinations were normal.      Bowel prep diffusely fair; semisolid and viscous stool obscured some       views throughout colon; diminutive or subtle sessile polyps could easily       have been missed.      A 3 mm polyp was found in the cecum. The polyp was sessile. The polyp       was removed with a cold biopsy forceps. Resection and retrieval were  complete.      A medium set of kissing polyps was found in the cecum. The polyp was       sessile. Area was successfully injected with saline for a lift       polypectomy. The polyp was removed with a hot snare. Resection and       retrieval were complete.      Colon otherwise normal; no other polyps, masses, vascular ectasias, or       inflammatory changes were seen.      The retroflexed view of the distal rectum and anal verge was normal and       showed no anal or rectal abnormalities. Impression:               - Preparation of the colon was fair.                           - One 3 mm polyp in the cecum, removed with a cold                             biopsy forceps. Resected and retrieved.                           - One medium polyp in the cecum, removed with a hot                            snare. Resected and retrieved. Injected.                           - The distal rectum and anal verge are normal on                            retroflexion view. Moderate Sedation:      None Recommendation:           - Patient has a contact number available for                            emergencies. The signs and symptoms of potential                            delayed complications were discussed with the                            patient. Return to normal activities tomorrow.                            Written discharge instructions were provided to the                            patient.                           - Discharge patient to home (ambulatory).                           - Resume previous diet today.                           -  Continue present medications.                           - Await pathology results.                           - Repeat colonoscopy in 2-3 years for surveillance                            based on pathology results.                           - Return to GI clinic PRN.                           - Return to referring physician as previously                            scheduled. Procedure Code(s):        --- Professional ---                           [phone], Colonoscopy, flexible; with removal of                            tumor(s), polyp(s), or other lesion(s) by snare                            technique                           45380, 55, Colonoscopy, flexible; with biopsy,                            single or multiple                           45381, Colonoscopy, flexible; with directed                            submucosal injection(s), any substance Diagnosis Code(s):        --- Professional ---                           D12.0, Benign neoplasm of cecum                            Z86.010, Personal history of colonic polyps CPT copyright 2016 American Medical Association. All rights reserved. The codes documented in this report are preliminary and upon coder review may  be revised to meet current compliance requirements. Arta Silence, MD 10/07/2017 9:30:18 AM This report has been signed electronically. Number of Addenda: 0

## 2017-10-07 NOTE — Anesthesia Preprocedure Evaluation (Signed)
Anesthesia Evaluation  Patient identified by MRN, date of birth, ID band Patient awake    Reviewed: Allergy & Precautions, NPO status , Patient's Chart, lab work & pertinent test results  Airway Mallampati: II  TM Distance: >3 FB Neck ROM: Full    Dental no notable dental hx.    Pulmonary neg pulmonary ROS, former smoker,    Pulmonary exam normal breath sounds clear to auscultation       Cardiovascular negative cardio ROS Normal cardiovascular exam Rhythm:Regular Rate:Normal     Neuro/Psych negative neurological ROS  negative psych ROS   GI/Hepatic negative GI ROS, Neg liver ROS, GERD  ,  Endo/Other  negative endocrine ROSHypothyroidism   Renal/GU negative Renal ROS  negative genitourinary   Musculoskeletal negative musculoskeletal ROS (+)   Abdominal   Peds negative pediatric ROS (+)  Hematology negative hematology ROS (+)   Anesthesia Other Findings   Reproductive/Obstetrics negative OB ROS                             Anesthesia Physical Anesthesia Plan  ASA: II  Anesthesia Plan: MAC   Post-op Pain Management:    Induction: Intravenous  PONV Risk Score and Plan: 1  Airway Management Planned: Nasal Cannula  Additional Equipment:   Intra-op Plan:   Post-operative Plan:   Informed Consent: I have reviewed the patients History and Physical, chart, labs and discussed the procedure including the risks, benefits and alternatives for the proposed anesthesia with the patient or authorized representative who has indicated his/her understanding and acceptance.   Dental advisory given  Plan Discussed with: CRNA and Surgeon  Anesthesia Plan Comments:         Anesthesia Quick Evaluation

## 2017-10-07 NOTE — Transfer of Care (Signed)
Immediate Anesthesia Transfer of Care Note  Patient: Julie Cross  Procedure(s) Performed: COLONOSCOPY WITH PROPOFOL (N/A )  Patient Location: Endoscopy Unit  Anesthesia Type:MAC  Level of Consciousness: awake and alert   Airway & Oxygen Therapy: Patient Spontanous Breathing  Post-op Assessment: Report given to RN and Post -op Vital signs reviewed and stable  Post vital signs: Reviewed and stable  Last Vitals:  Vitals:   10/07/17 0729  BP: 138/65  Pulse: 63  Resp: 15  Temp: 36.6 C  SpO2: 99%    Last Pain:  Vitals:   10/07/17 0729  TempSrc: Oral         Complications: No apparent anesthesia complications

## 2017-10-07 NOTE — Anesthesia Procedure Notes (Signed)
Date/Time: 10/07/2017 8:43 AM Performed by: Sharlette Dense, CRNA Oxygen Delivery Method: Simple face mask

## 2017-10-07 NOTE — H&P (Signed)
Patient interval history reviewed.  Patient examined again.  There has been no change from documented H/P dated 10/06/17 (scanned into chart from our office) except as documented above.  Assessment:  1.  Cecal polyp (biopsies = adenomatous).  Plan:  1.  Colonoscopy with possible polypectomy, possible endoscopic mucosal resection. 2.  Risks (bleeding, infection, bowel perforation that could require surgery, sedation-related changes in cardiopulmonary systems), benefits (identification and possible treatment of source of symptoms, exclusion of certain causes of symptoms), and alternatives (watchful waiting, radiographic imaging studies, empiric medical treatment) of colonoscopy were explained to patient/family in detail and patient wishes to proceed.  I did advise patient of relative increase in risk of complications from removal of her large cecal polyp (biopsies = adenoma), specifically increase in risk of bleeding and perforation.

## 2017-10-07 NOTE — Anesthesia Postprocedure Evaluation (Signed)
Anesthesia Post Note  Patient: Julie Cross  Procedure(s) Performed: COLONOSCOPY WITH PROPOFOL (N/A )     Patient location during evaluation: PACU Anesthesia Type: MAC Level of consciousness: awake and alert Pain management: pain level controlled Vital Signs Assessment: post-procedure vital signs reviewed and stable Respiratory status: spontaneous breathing, nonlabored ventilation, respiratory function stable and patient connected to nasal cannula oxygen Cardiovascular status: stable and blood pressure returned to baseline Postop Assessment: no apparent nausea or vomiting Anesthetic complications: no    Last Vitals:  Vitals:   10/07/17 0940 10/07/17 0950  BP: (!) 150/68 (!) 145/60  Pulse: 70 67  Resp: 15 15  Temp:    SpO2: 100% 100%    Last Pain:  Vitals:   10/07/17 0930  TempSrc: Oral                 Cliffie Gingras S

## 2017-10-09 ENCOUNTER — Encounter (HOSPITAL_COMMUNITY): Payer: Self-pay | Admitting: Gastroenterology

## 2017-11-24 ENCOUNTER — Emergency Department (HOSPITAL_COMMUNITY): Payer: 59

## 2017-11-24 ENCOUNTER — Emergency Department (HOSPITAL_COMMUNITY)
Admission: EM | Admit: 2017-11-24 | Discharge: 2017-11-24 | Disposition: A | Discharge status: Home / Self Care | Payer: 59 | Attending: Emergency Medicine | Admitting: Emergency Medicine

## 2017-11-24 ENCOUNTER — Encounter (HOSPITAL_COMMUNITY): Payer: Self-pay

## 2017-11-24 DIAGNOSIS — M542 Cervicalgia: Secondary | ICD-10-CM | POA: Diagnosis not present

## 2017-11-24 DIAGNOSIS — E039 Hypothyroidism, unspecified: Secondary | ICD-10-CM | POA: Insufficient documentation

## 2017-11-24 DIAGNOSIS — Z87891 Personal history of nicotine dependence: Secondary | ICD-10-CM | POA: Diagnosis not present

## 2017-11-24 DIAGNOSIS — R6884 Jaw pain: Secondary | ICD-10-CM

## 2017-11-24 DIAGNOSIS — Z79899 Other long term (current) drug therapy: Secondary | ICD-10-CM | POA: Insufficient documentation

## 2017-11-24 LAB — CBC WITH DIFFERENTIAL/PLATELET
BASOS PCT: 0 %
Basophils Absolute: 0 10*3/uL (ref 0.0–0.1)
Eosinophils Absolute: 0.2 10*3/uL (ref 0.0–0.7)
Eosinophils Relative: 4 %
HCT: 40.7 % (ref 36.0–46.0)
HEMOGLOBIN: 13.1 g/dL (ref 12.0–15.0)
LYMPHS ABS: 2.1 10*3/uL (ref 0.7–4.0)
Lymphocytes Relative: 43 %
MCH: 27.9 pg (ref 26.0–34.0)
MCHC: 32.2 g/dL (ref 30.0–36.0)
MCV: 86.8 fL (ref 78.0–100.0)
MONO ABS: 0.3 10*3/uL (ref 0.1–1.0)
MONOS PCT: 6 %
Neutro Abs: 2.3 10*3/uL (ref 1.7–7.7)
Neutrophils Relative %: 47 %
Platelets: 268 10*3/uL (ref 150–400)
RBC: 4.69 MIL/uL (ref 3.87–5.11)
RDW: 13.4 % (ref 11.5–15.5)
WBC: 4.9 10*3/uL (ref 4.0–10.5)

## 2017-11-24 LAB — BASIC METABOLIC PANEL
Anion gap: 10 (ref 5–15)
BUN: 15 mg/dL (ref 6–20)
CO2: 25 mmol/L (ref 22–32)
CREATININE: 0.84 mg/dL (ref 0.44–1.00)
Calcium: 9.2 mg/dL (ref 8.9–10.3)
Chloride: 104 mmol/L (ref 101–111)
GFR calc Af Amer: >60 mL/min (ref >60)
GFR calc non Af Amer: >60 mL/min (ref >60)
GLUCOSE: 106 mg/dL — AB (ref 65–99)
Potassium: 3.7 mmol/L (ref 3.5–5.1)
SODIUM: 139 mmol/L (ref 135–145)

## 2017-11-24 LAB — I-STAT TROPONIN, ED: TROPONIN I, POC: 0 ng/mL (ref 0.00–0.08)

## 2017-11-24 MED ORDER — NITROGLYCERIN 0.4 MG SL SUBL
0.4000 mg | SUBLINGUAL_TABLET | SUBLINGUAL | Status: DC | PRN
  Filled 2017-11-24 (×2): qty 1

## 2017-11-24 NOTE — Discharge Instructions (Signed)
Take aspirin 325 mg a day.  Keep your appointment with the cardiologist.  Return if you are having any problems.

## 2017-11-24 NOTE — ED Notes (Signed)
Pt states that her pain gone and that she feels back to normal and is ready to go home. RN notified

## 2017-11-24 NOTE — ED Provider Notes (Signed)
[facility] EMERGENCY DEPARTMENT Provider Note   CSN: [id number] Arrival date & time: 11/24/17  0449     History   Chief Complaint Chief Complaint  Patient presents with  . Jaw Pain    HPI Julie Cross is a 62 y.o. female.  The history is provided by the patient.  She has a history of GERD, hypothyroidism, hyperlipidemia and comes in because of an episode of dyspnea and diaphoresis.  For the last 2 weeks, she has noted dyspnea and diaphoresis if she walks up a hill.  However, there will be no problems if she walks on flat ground.  Yesterday, she developed some pain in the back of her neck which did radiate up to her jaw.  This pain was 4/10 at its worst, and is 1/10 currently.  Nothing made this pain better, nothing made it worse.  At 3 AM, she woke up to go to the bathroom, and on returning to bed, noted dyspnea, diaphoresis, nausea.  She also noted that her heart rate was higher than it normally was.  It was checked with a home blood pressure monitor and heart rate got up into the 90s.  She was unable to bring her heart rate down with medication.  There has never been any chest discomfort.  She did chew aspirin at home and called her PCP who recommended she come for evaluation in the ED.  She did see her PCP in the office yesterday and does have a cardiology evaluation set up for January 10 with Dr. Saunders Revel.  She is a non-smoker with no history of hypertension or diabetes.  There is a family history of premature coronary atherosclerosis, but this was in a grandparent.  No premature coronary atherosclerosis in first-degree relatives.  Past Medical History:  Diagnosis Date  . GERD (gastroesophageal reflux disease)    weight loss has help improve  . Hypothyroidism   . Migraine   . Pancreatitis    11'14- no issues now  . PONV (postoperative nausea and vomiting)   . Thyroid disease    right thyroid neoplasm -surgery planned 09-25-15    Patient Active Problem List   Diagnosis Date Noted  . 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 Thyroid nodule 07/03/2015  . Dyslipidemia 07/02/2015  . Migraine 07/02/2015  . Anxiety state 07/02/2015  . GERD (gastroesophageal reflux disease) 07/02/2015  . Pancreatitis 09/15/2013  . Choledocholithiasis 09/15/2013  . Hypothyroidism 09/15/2013    Past Surgical History:  Procedure Laterality Date  . BREAST SURGERY  2006   biopsy-right-benign  . CHOLECYSTECTOMY  2007  . COLONOSCOPY WITH PROPOFOL N/A 10/07/2017   Procedure: COLONOSCOPY WITH PROPOFOL;  Surgeon: Arta Silence, MD;  Location: [facility] ENDOSCOPY;  Service: Endoscopy;  Laterality: N/A;  EMR need Polyp lifting device  . ERCP N/A 09/17/2013   Procedure: ENDOSCOPIC RETROGRADE CHOLANGIOPANCREATOGRAPHY (ERCP);  Surgeon: Missy Sabins, MD;  Location: [facility] ENDOSCOPY;  Service: Endoscopy;  Laterality: N/A;  . ERCP N/A 09/23/2013   Procedure: ENDOSCOPIC RETROGRADE CHOLANGIOPANCREATOGRAPHY (ERCP);  Surgeon: Jeryl Columbia, MD;  Location: [facility];  Service: Endoscopy;  Laterality: N/A;  . HERNIA REPAIR Right 1999  . THYROID LOBECTOMY Right 09/25/2015   Procedure: RIGHT THYROID LOBECTOMY;  Surgeon: Armandina Gemma, MD;  Location: [facility] ORS;  Service: General;  Laterality: Right;  . TUBAL LIGATION  1980's    OB History    No data available       Home Medications    Prior to  Admission medications   Medication Sig Start Date End Date Taking? Authorizing Provider  calcium carbonate (TUMS - DOSED IN MG ELEMENTAL CALCIUM) 500 MG chewable tablet Chew 2 tablets at bedtime as needed by mouth for indigestion or heartburn.    [provider]  clonazePAM (KLONOPIN) 0.5 MG tablet Take 0.5 mg by mouth daily as needed for anxiety.     [provider]  fluticasone (FLONASE) 50 MCG/ACT nasal spray Place 1 spray daily as needed into both nostrils for allergies or rhinitis.    [provider]  ibuprofen (ADVIL,MOTRIN) 200 MG tablet  Take 200 mg 3 (three) times daily as needed by mouth for headache or moderate pain.    [provider]  isometheptene-acetaminophen-dichloralphenazone (MIDRIN) 65-325-100 MG capsule Take 1 capsule by mouth 4 (four) times daily as needed for migraine. Maximum 5 capsules in 12 hours for migraine headaches, 8 capsules in 24 hours for tension headaches.    [provider]  levothyroxine (SYNTHROID, LEVOTHROID) 125 MCG tablet Take 125 mcg by mouth daily before breakfast.  04/17/15   [provider]    Family History Family History  Problem Relation Age of Onset  . Breast cancer Mother   . Lung cancer Father   . Hodgkin's lymphoma Brother   . Stroke Other   . CAD Other   . Thyroid disease Son   . Thyroid disease Sister     Social History Social History   Tobacco Use  . Smoking status: Former Smoker    Years: 8.00    Types: Cigarettes    Last attempt to quit: 09/18/1972    Years since quitting: 45.2  . Smokeless tobacco: Never Used  Substance Use Topics  . Alcohol use: Yes    Comment: rare social occ x2 month  . Drug use: No     Allergies   Gabapentin   Review of Systems Review of Systems  All other systems reviewed and are negative.    Physical Exam Updated Vital Signs BP (!) 125/57   Pulse 64   Temp 97.9 F (36.6 C) (Oral)   Resp 17   SpO2 95%   Physical Exam  Nursing note and vitals reviewed.  62 year old female, resting comfortably and in no acute distress. Vital signs are normal. Oxygen saturation is 95%, which is normal. Head is normocephalic and atraumatic. PERRLA, EOMI. Oropharynx is clear. Neck is mildly tender over the cervical spine and paracervical muscles.  Neck is supple without adenopathy or JVD. Back is nontender and there is no CVA tenderness. Lungs are clear without rales, wheezes, or rhonchi. Chest is nontender. Heart has regular rate and rhythm without murmur. Abdomen is soft, flat, nontender without masses or  hepatosplenomegaly and peristalsis is normoactive. Extremities have no cyanosis or edema, full range of motion is present. Skin is warm and dry without rash. Neurologic: Mental status is normal, cranial nerves are intact, there are no motor or sensory deficits.  ED Treatments / Results  Labs (all labs ordered are listed, but only abnormal results are displayed) Labs Reviewed  BASIC METABOLIC PANEL - Abnormal; Notable for the following components:      Result Value   Glucose, Bld 106 (*)    All other components within normal limits  CBC WITH DIFFERENTIAL/PLATELET  I-STAT TROPONIN, ED    EKG  EKG Interpretation  Date/Time:  Tuesday November 24 2017 04:56:58 EST Ventricular Rate:  62 PR Interval:    QRS Duration: 91 QT Interval:  433 QTC  Calculation: 440 R Axis:   29 Text Interpretation:  Sinus rhythm Low voltage, precordial leads When compared with ECG of 09/14/2013, Low voltage QRS is now present Confirmed by Delora Fuel ([id number]) on 11/24/2017 5:03:04 AM       Radiology Dg Chest 2 View  Result Date: 11/24/2017 CLINICAL DATA:  Awoke with jaw and shoulder pain. EXAM: CHEST  2 VIEW COMPARISON:  09/19/2015 FINDINGS: The cardiomediastinal contours are normal. Mild aortic atherosclerosis. There is a retrocardiac hiatal hernia. Pulmonary vasculature is normal. No consolidation, pleural effusion, or pneumothorax. No acute osseous abnormalities are seen. IMPRESSION: 1. No acute abnormality. 2. Retrocardiac hiatal hernia. Aortic Atherosclerosis (ICD10-I70.0). Electronically Signed   By: Jeb Levering M.D.   On: 11/24/2017 05:56    Procedures Procedures (including critical care time)  Medications Ordered in ED Medications  nitroGLYCERIN (NITROSTAT) SL tablet 0.4 mg (not administered)     Initial Impression / Assessment and Plan / ED Course  I have reviewed the triage vital signs and the nursing notes.  Pertinent labs & imaging results that were available during my care of the  patient were reviewed by me and considered in my medical decision making (see chart for details).  Exertional dyspnea, neck and jaw discomfort.  This is certainly concerning for possible cardiac etiology.  However, ECG shows no acute changes.  There is very low voltage in the precordial leads and this is new, but last ECG on record was in 2014.  Heart score is 3, which puts her at low risk for major adverse cardiac events in the next 30 days.  She still has some neck and jaw pain, will give trial of nitroglycerin.  Old records are reviewed, and she has no relevant past visits.  Jaw pain resolved before she got nitroglycerin.  Laboratory workup is reassuring.  Patient was ambulated in the ED and had no recurrence of dyspnea.  It was felt that she is safe for discharge at this point.  She is discharged with instructions to continue taking aspirin once a day and she is to keep her follow-up appointment with cardiology in 2 days.  Final Clinical Impressions(s) / ED Diagnoses   Final diagnoses:  Neck pain  Jaw pain    ED Discharge Orders    None       Delora Fuel, MD 54/62/70 [id number]

## 2017-11-24 NOTE — ED Notes (Signed)
Pt ambulated to the bathroom in Pod A with no assistance or difficulty. Pt has no complaints

## 2017-11-24 NOTE — ED Triage Notes (Signed)
Pt comes via [facility] woke up tonight with Jaw and shoulder pain. Seen PCP yesterday for the same, told to follow up with cardiology. Pt states she was diaphoretic and her heart was racing.

## 2017-11-26 ENCOUNTER — Telehealth (HOSPITAL_COMMUNITY): Payer: Self-pay | Admitting: *Deleted

## 2017-11-26 ENCOUNTER — Ambulatory Visit (INDEPENDENT_AMBULATORY_CARE_PROVIDER_SITE_OTHER): Payer: 59 | Admitting: Internal Medicine

## 2017-11-26 ENCOUNTER — Encounter: Payer: Self-pay | Admitting: Internal Medicine

## 2017-11-26 VITALS — BP 130/84 | HR 56 | Ht 62.0 in | Wt 175.8 lb

## 2017-11-26 DIAGNOSIS — R5383 Other fatigue: Secondary | ICD-10-CM | POA: Diagnosis not present

## 2017-11-26 DIAGNOSIS — I208 Other forms of angina pectoris: Secondary | ICD-10-CM

## 2017-11-26 DIAGNOSIS — R002 Palpitations: Secondary | ICD-10-CM

## 2017-11-26 DIAGNOSIS — E7800 Pure hypercholesterolemia, unspecified: Secondary | ICD-10-CM

## 2017-11-26 NOTE — Telephone Encounter (Signed)
Patient given detailed instructions per Myocardial Perfusion Study Information Sheet for the test on 11/30/17 Patient notified to arrive 15 minutes early and that it is imperative to arrive on time for appointment to keep from having the test rescheduled.  If you need to cancel or reschedule your appointment, please call the office within 24 hours of your appointment. . Patient verbalized understanding. Tanekia Ryans Jacqueline    

## 2017-11-26 NOTE — Patient Instructions (Addendum)
Medication Instructions:   Decrease Aspirin to 81mg  by mouth daily.   -- If you need a refill on your cardiac medications before your next appointment, please call your pharmacy. --  Labwork: None ordered  Testing/Procedures:  Your physician has requested that you have en exercise stress myoview. For further information please visit [email or web address]. Please follow instruction sheet, as given.   Follow-Up: Your physician wants you to follow-up in: 1 month with Dr. Saunders Revel.    Thank you for choosing [facility]!!   Any Other Special Instructions Will Be Listed Below (If Applicable).

## 2017-11-26 NOTE — Progress Notes (Signed)
New Outpatient Visit Date: 11/26/2017  Referring Provider: Gaynelle Arabian, MD [place] [address] [place], [place] [address]  Chief Complaint: Fatigue and neck, shoulder, and jaw pain  HPI:  Julie Cross is a 62 y.o. female who is being seen today for the evaluation of neck/jaw pain and exertional dyspnea at the request of Dr. Marisue Humble. She has a history of hyperlipidemia, hypothyroidism, and GERD. Julie Cross presented to the emergency department 2 days ago with fatigue and neck/jaw pain over the last 2 weeks.  Today, Julie Cross reports that she is feeling well.  However, she has not been pushing herself much since her recent ED visit.  She states that over the last few weeks, she has noted marked fatigue after going on her normal walk.  She often will need to lie down for 1-4 hours in order to feel better.  This past weekend, she developed pain in her shoulders and neck, radiating to the jaw, which began while she was resting shortly after having gotten up in the morning.  After speaking with the on-call physician for her PCP, she was directed to go to the emergency department.  ED workup was unremarkable.  Julie Cross notes that at the time of her neck/jaw discomfort, her heart rate was notably higher than normal, around 90-100 bpm.  She felt diaphoretic at the time. After the arrival of EMS, her heart rate gradually returned back to her baseline around 70 bpm.  At no point, has Julie Cross experienced any chest pain, shortness of breath, orthopnea, and edema.  Julie Cross denies a history of prior cardiac disease.  She underwent a stress test more than 20 years ago for chest pain and was diagnosed with GERD, stating that the stress test was normal.  She notes that her thyroid replacement was adjusted this summer, and she is concerned that she may be somewhat hyperthyroid.  She is planning to have her TSH checked later today.  She has been trying to lose weight through diet  and exercise.  She consumes 1 cup of caffeinated coffee per day.  --------------------------------------------------------------------------------------------------  Cardiovascular History & Procedures: Cardiovascular Problems:  Atypical angina (shoulder/neck and jaw discomfort  Exertional fatigue  Palpitations  Risk Factors:  Hyperlipidemia and obesity  Cath/PCI:  None  CV Surgery:  None  EP Procedures and Devices:  None  Non-Invasive Evaluation(s):  None available (patient reports normal stress test 20+ years ago)  Recent CV Pertinent Labs: Lab Results  Component Value Date   K 3.7 11/24/2017   BUN 15 11/24/2017   CREATININE 0.84 11/24/2017    --------------------------------------------------------------------------------------------------  Past Medical History:  Diagnosis Date  . GERD (gastroesophageal reflux disease)    weight loss has help improve  . Hypothyroidism   . Migraine   . Pancreatitis    11'14- no issues now  . PONV (postoperative nausea and vomiting)   . 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 BREAST SURGERY  2006   biopsy-right-benign  . CHOLECYSTECTOMY  2007  . COLONOSCOPY WITH PROPOFOL N/A 10/07/2017   Procedure: COLONOSCOPY WITH PROPOFOL;  Surgeon: Arta Silence, MD;  Location: [facility] ENDOSCOPY;  Service: Endoscopy;  Laterality: N/A;  EMR need Polyp lifting device  . ERCP N/A 09/17/2013   Procedure: ENDOSCOPIC RETROGRADE CHOLANGIOPANCREATOGRAPHY (ERCP);  Surgeon: Missy Sabins, MD;  Location: [facility] ENDOSCOPY;  Service: Endoscopy;  Laterality: N/A;  . ERCP N/A  09/23/2013   Procedure: ENDOSCOPIC RETROGRADE CHOLANGIOPANCREATOGRAPHY (ERCP);  Surgeon: Jeryl Columbia, MD;  Location: [facility];  Service: Endoscopy;  Laterality: N/A;  . HERNIA REPAIR Right 1999  . THYROID LOBECTOMY Right 09/25/2015   Procedure: RIGHT THYROID LOBECTOMY;  Surgeon: Armandina Gemma, MD;   Location: [facility] ORS;  Service: General;  Laterality: Right;  . TUBAL LIGATION  1980's    Current Meds  Medication Sig  . aspirin 325 MG tablet Take 325 mg by mouth daily.  . calcium carbonate (TUMS - DOSED IN MG ELEMENTAL CALCIUM) 500 MG chewable tablet Chew 2 tablets at bedtime as needed by mouth for indigestion or heartburn.  . clonazePAM (KLONOPIN) 0.5 MG tablet Take 0.5 mg by mouth daily as needed for anxiety.   . fluticasone (FLONASE) 50 MCG/ACT nasal spray Place 1 spray daily as needed into both nostrils for allergies or rhinitis.  Marland Kitchen ibuprofen (ADVIL,MOTRIN) 200 MG tablet Take 200 mg 3 (three) times daily as needed by mouth for headache or moderate pain.  Marland Kitchen isometheptene-acetaminophen-dichloralphenazone (MIDRIN) 65-325-100 MG capsule Take 1 capsule by mouth 4 (four) times daily as needed for migraine. Maximum 5 capsules in 12 hours for migraine headaches, 8 capsules in 24 hours for tension headaches.  . levothyroxine (SYNTHROID, LEVOTHROID) 125 MCG tablet Take 125 mcg by mouth daily before breakfast.   . Psyllium (METAMUCIL FIBER PO) Take by mouth. Take 1 tsp by mouth daily    Allergies: Gabapentin  Social History   Socioeconomic History  . Marital status: Married    Spouse name: Not on file  . Number of children: Not on file  . Years of education: Not on file  . Highest education level: Not on file  Social Needs  . Financial resource strain: Not on file  . Food insecurity - worry: Not on file  . Food insecurity - inability: Not on file  . Transportation needs - medical: Not on file  . Transportation needs - non-medical: Not on file  Occupational History  . Not on file  Tobacco Use  . Smoking status: Former Smoker    Years: 8.00    Types: Cigarettes    Last attempt to quit: 09/18/1972    Years since quitting: 45.2  . Smokeless tobacco: Never Used  Substance and Sexual Activity  . Alcohol use: Yes    Comment: rare social occ x2 month  . Drug use: No  . Sexual activity:  Not on file  Other Topics Concern  . Not on file  Social History Narrative  . Not on file    Family History  Problem Relation Age of Onset  . Breast cancer Mother   . Lung cancer Father   . Hodgkin's lymphoma Brother   . Stroke Other   . CAD Other   . Thyroid disease Son   . Thyroid disease Sister     Review of Systems: A 12-system review of systems was performed and was negative except as noted in the HPI.  --------------------------------------------------------------------------------------------------  Physical Exam: BP 130/84 (BP Location: Left Arm, Patient Position: Sitting, Cuff Size: Normal)   Pulse (!) 56   Ht 5\' 2"  (1.575 m)   Wt 175 lb 12.8 oz (79.7 kg)   SpO2 96%   BMI 32.15 kg/m   General: Obese woman, seated comfortably in the exam room.  She is accompanied by her husband. HEENT: No conjunctival pallor or scleral icterus. Moist mucous membranes. OP clear. Neck: Supple without lymphadenopathy, thyromegaly, JVD, or HJR. No carotid bruit. Lungs: Normal  work of breathing. Clear to auscultation bilaterally without wheezes or crackles. Heart: Bradycardic but regular without murmurs, rubs, or gallops.  Unable to assess PMI due to body habitus. Abd: Bowel sounds present. Soft, NT/ND without hepatosplenomegaly Ext: No lower extremity edema. Radial, PT, and DP pulses are 2+ bilaterally Skin: Warm and dry without rash. Neuro: CNIII-XII intact. Strength and fine-touch sensation intact in upper and lower extremities bilaterally. Psych: Normal mood and affect.  EKG: Sinus bradycardia (heart rate 56 bpm).  Low voltage.  Otherwise, no significant abnormalities.  Lab Results  Component Value Date   WBC 4.9 11/24/2017   HGB 13.1 11/24/2017   HCT 40.7 11/24/2017   MCV 86.8 11/24/2017   PLT 268 11/24/2017    Lab Results  Component Value Date   NA 139 11/24/2017   K 3.7 11/24/2017   CL 104 11/24/2017   CO2 25 11/24/2017   BUN 15 11/24/2017   CREATININE 0.84  11/24/2017   GLUCOSE 106 (H) 11/24/2017   ALT 29 09/23/2013    No results found for: CHOL, HDL, LDLCALC, LDLDIRECT, TRIG, CHOLHDL   --------------------------------------------------------------------------------------------------  ASSESSMENT AND PLAN: Atypical angina Though the patient has not had any chest pain, I am concerned that her recent neck/shoulder pain rating to the jaw and marked exertional fatigue could represent an anginal equivalent.  Cardiac risk factors include hyperlipidemia and obesity.  EKG today does not show any ischemic changes.  We have discussed further evaluation options and have agreed to begin with an exercise myocardial perfusion stress test.  I have recommended that Ms. Dusing decrease aspirin to 81 mg daily.  I will defer further medication adjustments at this time, though she may ultimately benefit from statin therapy, given recent LDL of 175.  Hyperlipidemia Labs in 06/2017 were notable for an LDL of 175.  If there is evidence of CAD, we will certainly need to treat this aggressively.  Even if her stress test is normal, I would suggest further lifestyle modifications and consideration of statin therapy for primary prevention.  Palpitations and fatigue It is possible some of the patient's symptoms could be related to thyroid dysfunction.  I have encouraged her to proceed with thyroid function tests planned for later today.  I will defer further management to Dr. Brigitte Pulse.  If stress test and PFTs are unrevealing, we will need to consider echocardiogram and event monitor placement in the future.  Follow-up: Return to clinic in 1 month.  Nelva Bush, MD 11/26/2017 11:57 AM

## 2017-11-27 ENCOUNTER — Encounter: Payer: Self-pay | Admitting: Internal Medicine

## 2017-11-27 DIAGNOSIS — R002 Palpitations: Secondary | ICD-10-CM | POA: Insufficient documentation

## 2017-11-27 DIAGNOSIS — E7800 Pure hypercholesterolemia, unspecified: Secondary | ICD-10-CM | POA: Insufficient documentation

## 2017-11-27 DIAGNOSIS — I208 Other forms of angina pectoris: Secondary | ICD-10-CM | POA: Insufficient documentation

## 2017-11-27 DIAGNOSIS — R5383 Other fatigue: Secondary | ICD-10-CM | POA: Insufficient documentation

## 2017-11-30 ENCOUNTER — Encounter (HOSPITAL_COMMUNITY): Payer: 59

## 2017-12-02 ENCOUNTER — Telehealth: Payer: Self-pay | Admitting: Internal Medicine

## 2017-12-02 NOTE — Telephone Encounter (Signed)
Pt would like to know the status of her myoview being authorized. Please call and advise.

## 2017-12-02 NOTE — Telephone Encounter (Signed)
Auth was obtained.  Schedulers are trying to contact pt to reschedule myoview.

## 2017-12-03 ENCOUNTER — Telehealth (HOSPITAL_COMMUNITY): Payer: Self-pay | Admitting: *Deleted

## 2017-12-03 NOTE — Telephone Encounter (Signed)
Patient given detailed instructions per Myocardial Perfusion Study Information Sheet for the test on 12/08/17. Patient notified to arrive 15 minutes early and that it is imperative to arrive on time for appointment to keep from having the test rescheduled.  If you need to cancel or reschedule your appointment, please call the office within 24 hours of your appointment. . Patient verbalized understanding. Julie Cross Jacqueline    

## 2017-12-08 ENCOUNTER — Ambulatory Visit (HOSPITAL_COMMUNITY): Payer: 59 | Attending: Cardiology

## 2017-12-08 DIAGNOSIS — R5383 Other fatigue: Secondary | ICD-10-CM | POA: Diagnosis not present

## 2017-12-08 DIAGNOSIS — I2510 Atherosclerotic heart disease of native coronary artery without angina pectoris: Secondary | ICD-10-CM | POA: Diagnosis not present

## 2017-12-08 DIAGNOSIS — R002 Palpitations: Secondary | ICD-10-CM

## 2017-12-08 DIAGNOSIS — I208 Other forms of angina pectoris: Secondary | ICD-10-CM

## 2017-12-08 DIAGNOSIS — R079 Chest pain, unspecified: Secondary | ICD-10-CM | POA: Diagnosis present

## 2017-12-08 DIAGNOSIS — I2089 Other forms of angina pectoris: Secondary | ICD-10-CM

## 2017-12-08 LAB — MYOCARDIAL PERFUSION IMAGING
CHL CUP NUCLEAR SRS: 4
CHL CUP NUCLEAR SSS: 6
CHL CUP RESTING HR STRESS: 66 {beats}/min
CSEPEDS: 0 s
CSEPEW: 10.1 METS
CSEPHR: 99 %
CSEPPHR: 157 {beats}/min
Exercise duration (min): 9 min
LV dias vol: 60 mL (ref 46–106)
LV sys vol: 16 mL
MPHR: 158 {beats}/min
RATE: 0.32
SDS: 3
TID: 0.66

## 2017-12-08 MED ORDER — TECHNETIUM TC 99M TETROFOSMIN IV KIT
32.7000 | PACK | Freq: Once | INTRAVENOUS | Status: AC | PRN
  Administered 2017-12-08: 32.7 via INTRAVENOUS
  Filled 2017-12-08: qty 33

## 2017-12-08 MED ORDER — TECHNETIUM TC 99M TETROFOSMIN IV KIT
10.0000 | PACK | Freq: Once | INTRAVENOUS | Status: AC | PRN
  Administered 2017-12-08: 10 via INTRAVENOUS
  Filled 2017-12-08: qty 10

## 2017-12-31 ENCOUNTER — Encounter: Payer: Self-pay | Admitting: Internal Medicine

## 2017-12-31 ENCOUNTER — Ambulatory Visit (INDEPENDENT_AMBULATORY_CARE_PROVIDER_SITE_OTHER): Payer: 59 | Admitting: Internal Medicine

## 2017-12-31 VITALS — BP 110/80 | HR 70 | Ht 62.0 in | Wt 173.8 lb

## 2017-12-31 DIAGNOSIS — R0789 Other chest pain: Secondary | ICD-10-CM | POA: Diagnosis not present

## 2017-12-31 DIAGNOSIS — E7800 Pure hypercholesterolemia, unspecified: Secondary | ICD-10-CM | POA: Diagnosis not present

## 2017-12-31 DIAGNOSIS — R002 Palpitations: Secondary | ICD-10-CM

## 2017-12-31 NOTE — Patient Instructions (Addendum)
Medication Instructions:  Your physician recommends that you continue on your current medications as directed. Please refer to the Current Medication list given to you today.  -- If you need a refill on your cardiac medications before your next appointment, please call your pharmacy. --  Labwork: None ordered  Testing/Procedures: None ordered  Follow-Up: Your physician wants you to follow-up as needed with Dr. End.    Thank you for choosing [facility]!!    Any Other Special Instructions Will Be Listed Below (If Applicable).         

## 2017-12-31 NOTE — Progress Notes (Signed)
Follow-up Outpatient Visit Date: 12/31/2017  Primary Care Provider: Mayra Neer, MD [place] Cross [id number]  Chief Complaint: Follow-up stress test  HPI:  Ms. Whitworth is a 62 y.o. year-old female with history of hyperlipidemia, hypothyroidism, and GERD, who presents for follow-up of atypical chest pain. I met her last month following EGD evaluation for neck/jaw pain. He agreed to perform a myocardial perfusion stress test, which was normal without evidence of ischemia or scar. Today, Ms. Friel reports feeling better, though she still notes occasional palpitations, nausea, and dizziness. She has not had any further neck/jaw pain. She was asymptomatic when exercising as part of recent stress test.  --------------------------------------------------------------------------------------------------  Cardiovascular History & Procedures: Cardiovascular Problems:  Atypical angina (shoulder/neck and jaw discomfort  Exertional fatigue  Palpitations  Risk Factors:  Hyperlipidemia and obesity  Cath/PCI:  None  CV Surgery:  None  EP Procedures and Devices:  None  Non-Invasive Evaluation(s):  Exercise MPI (12/08/17): Normal study without ischemia or scar. LVEF greater than 65%. Patient exercised 9 minutes, zero seconds achieving 99% MPHR and 10.1 METS.  Recent CV Pertinent Labs: Lab Results  Component Value Date   K 3.7 11/24/2017   BUN 15 11/24/2017   CREATININE 0.84 11/24/2017    Past medical and surgical history were reviewed and updated in EPIC.  Current Meds  Medication Sig  . calcium carbonate (TUMS - DOSED IN MG ELEMENTAL CALCIUM) 500 MG chewable tablet Chew 2 tablets at bedtime as needed by mouth for indigestion or heartburn.  . clonazePAM (KLONOPIN) 0.5 MG tablet Take 0.5 mg by mouth daily as needed for anxiety.   Marland Kitchen ibuprofen (ADVIL,MOTRIN) 200 MG tablet Take 200 mg 3 (three) times daily as needed by mouth for headache or  moderate pain.  Marland Kitchen isometheptene-acetaminophen-dichloralphenazone (MIDRIN) 65-325-100 MG capsule Take 1 capsule by mouth 4 (four) times daily as needed for migraine. Maximum 5 capsules in 12 hours for migraine headaches, 8 capsules in 24 hours for tension headaches.  . levothyroxine (SYNTHROID, LEVOTHROID) 125 MCG tablet Take 125 mcg by mouth daily before breakfast.   . Psyllium (METAMUCIL FIBER PO) Take by mouth. Take 1 tsp by mouth daily  . [DISCONTINUED] fluticasone (FLONASE) 50 MCG/ACT nasal spray Place 1 spray daily as needed into both nostrils for allergies or rhinitis.    Allergies: Gabapentin  Social History   Socioeconomic History  . Marital status: Married    Spouse name: Not on file  . Number of children: Not on file  . Years of education: Not on file  . Highest education level: Not on file  Social Needs  . Financial resource strain: Not on file  . Food insecurity - worry: Not on file  . Food insecurity - inability: Not on file  . Transportation needs - medical: Not on file  . Transportation needs - non-medical: Not on file  Occupational History  . Not on file  Tobacco Use  . Smoking status: Former Smoker    Years: 8.00    Types: Cigarettes    Last attempt to quit: 09/18/1972    Years since quitting: 45.3  . Smokeless tobacco: Never Used  Substance and Sexual Activity  . Alcohol use: Yes    Comment: rare social occ x2 month  . Drug use: No  . Sexual activity: Not on file  Other Topics Concern  . Not on file  Social History Narrative  . Not on file    Family History  Problem Relation Age of  Onset  . Breast cancer Mother   . Lung cancer Father   . Hodgkin's lymphoma Brother   . Stroke Other   . CAD Other   . Thyroid disease Son   . Thyroid disease Sister     Review of Systems: A 12-system review of systems was performed and was negative except as noted in the  HPI.  --------------------------------------------------------------------------------------------------  Physical Exam: BP 110/80   Pulse 70   Ht '5\' 2"'$  (1.575 m)   Wt 173 lb 12.8 oz (78.8 kg)   SpO2 98%   BMI 31.79 kg/m   General:  Obese woman, seated comfortably in the exam room. HEENT: No conjunctival pallor or scleral icterus. Moist mucous membranes.  OP clear. Neck: No JVD. Lungs: Normal work of breathing. Clear to auscultation bilaterally without wheezes or crackles. Heart: Regular rate and rhythm without murmurs, rubs, or gallops. Non-displaced PMI. Abd: Bowel sounds present. Soft, NT/ND without hepatosplenomegaly Ext: No lower extremity edema.  Lab Results  Component Value Date   WBC 4.9 11/24/2017   HGB 13.1 11/24/2017   HCT 40.7 11/24/2017   MCV 86.8 11/24/2017   PLT 268 11/24/2017    Lab Results  Component Value Date   NA 139 11/24/2017   K 3.7 11/24/2017   CL 104 11/24/2017   CO2 25 11/24/2017   BUN 15 11/24/2017   CREATININE 0.84 11/24/2017   GLUCOSE 106 (H) 11/24/2017   ALT 29 09/23/2013    No results found for: CHOL, HDL, LDLCALC, LDLDIRECT, TRIG, CHOLHDL  --------------------------------------------------------------------------------------------------  ASSESSMENT AND PLAN: Atypical chest pain No recurrence since last visit. Exercise MPI was low risk without ischemia/scar and normal LVEF. No further work-up at this time.  Palpitations Still happening most days but seems to be improving in frequency and duration. We discussed ambulatory event monitoring, but Ms. Mehta wishes to defer this for now unless her symptoms worsen.  Hyperlipidemia Outside LDL noted to be 175 in 06/2017. We discussed lifestyle modifications, which she has started. We also discussed risks and benefits of adding a statin and further risk assessment with coronary calcium score. Ms. Alridge dose not wish to start a statin due to concerns about hyperglycemia. She also is not  interested in coronary calcium score. She wishes to discuss lipid management with her PCP. I will defer ongoing management to Dr. Brigitte Pulse.  Follow-up: Return to clinic as needed if now or worsening symptoms develop.  Nelva Bush, MD 12/31/2017 8:29 AM

## 2018-05-11 ENCOUNTER — Other Ambulatory Visit: Payer: Self-pay | Admitting: Family Medicine

## 2018-05-11 DIAGNOSIS — Z1231 Encounter for screening mammogram for malignant neoplasm of breast: Secondary | ICD-10-CM

## 2018-07-21 ENCOUNTER — Ambulatory Visit
Admission: RE | Admit: 2018-07-21 | Discharge: 2018-07-21 | Disposition: A | Discharge status: Home / Self Care | Payer: 59 | Source: Ambulatory Visit | Attending: Family Medicine | Admitting: Family Medicine

## 2018-07-21 DIAGNOSIS — Z1231 Encounter for screening mammogram for malignant neoplasm of breast: Secondary | ICD-10-CM

## 2018-08-03 ENCOUNTER — Ambulatory Visit: Payer: Self-pay | Admitting: Surgery

## 2018-08-03 NOTE — H&P (Signed)
Julie Cross Documented: 08/03/2018 10:33 AM Location: [facility] Patient #: [id number] DOB: 09-03-1956 Married / Language: [name] / Race: White Female  History of Present Illness Julie Hector MD; 08/03/2018 11:22 AM) The patient is a 62 year old female who presents with an inguinal hernia. Note for "Inguinal hernia": ` ` ` Patient sent for surgical consultation at the request of Dr Temple Pacini  Chief Complaint: Left groin discomfort. Possible hernia. ` ` The patient is a pleasant woman with a history of prior right inguinal hernia repair. Initially symptoms are vague and then she presented with incarcerated hernia that required urgent repair. This was 20 years ago when they were done [place] this state. She is concerned that she has symptoms on the other side and she may have the left one. Saw her primary care physician. Inguinal hernia suspected. Surgical consultation requested. Feels intermittent lump/swelling in her left groin especially when she is up and active. Not always there. Cannot be definitely detected the primary care office but history suspicious.  Patient comes in today with her husband. She notes she feels a lump and discomfort especially when she is out walking. Usually worse by the end of the 4 mile walk. Also allergies with her grandchildren. Usually she lies down she feels things reduced down and feel better. No problems with urination. She does have some constipation issues and moves her bowels maybe twice a week. Metamucil. No bleeding. No abdominal surgery. 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 Harlow Asa. Sent to me given expertise and laparoscopic surgery and sooner clinic time since they are hoping to address this before the end of the year.  (Review of systems as stated in this history (HPI) or in the review of systems. Otherwise all other 12 point ROS are  negative) ` ` `   Allergies ([name], LPN; 8/92/1194 17:40 AM) Gabapentin *CHEMICALS* Allergies Reconciled  Medication History ([name], LPN; 06/30/4817 56:31 AM) Metamucil Plus Calcium (Oral) Active.    Vitals ([name] LPN; 4/97/0263 78:58 AM) 08/03/2018 10:34 AM Weight: 162.5 lb Height: 62in Body Surface Area: 1.75 m Body Mass Index: 29.72 kg/m  Temp.: 98.21F  Pulse: 64 (Regular)  Resp.: 18 (Unlabored)  P.OX: 98% (Room air) BP: 110/82 (Sitting, Left Arm, Standard)      Physical Exam Julie Hector MD; 08/03/2018 11:17 AM)  General Mental Status-Alert. General Appearance-Not in acute distress, Not Sickly. Orientation-Oriented X3. Hydration-Well hydrated. Voice-Normal.  Integumentary Global Assessment Upon inspection and palpation of skin surfaces of the - Axillae: non-tender, no inflammation or ulceration, no drainage. and Distribution of scalp and body hair is normal. General Characteristics Temperature - normal warmth is noted.  Head and Neck Head-normocephalic, atraumatic with no lesions or palpable masses. Face Global Assessment - atraumatic, no absence of expression. Neck Global Assessment - no abnormal movements, no bruit auscultated on the right, no bruit auscultated on the left, no decreased range of motion, non-tender. Trachea-midline. Thyroid Gland Characteristics - non-tender.  Eye Eyeball - Left-Extraocular movements intact, No Nystagmus. Eyeball - Right-Extraocular movements intact, No Nystagmus. Cornea - Left-No Hazy. Cornea - Right-No Hazy. Sclera/Conjunctiva - Left-No scleral icterus, No Discharge. Sclera/Conjunctiva - Right-No scleral icterus, No Discharge. Pupil - Left-Direct reaction to light normal. Pupil - Right-Direct reaction to light normal.  ENMT Ears Pinna - Left - no drainage observed, no generalized tenderness observed. Right - no drainage  observed, no generalized tenderness observed. Nose and Sinuses  External Inspection of the Nose - no destructive lesion observed. Inspection of the nares - Left - quiet respiration. Right - quiet respiration. Mouth and Throat Lips - Upper Lip - no fissures observed, no pallor noted. Lower Lip - no fissures observed, no pallor noted. Nasopharynx - no discharge present. Oral Cavity/Oropharynx - Tongue - no dryness observed. Oral Mucosa - no cyanosis observed. Hypopharynx - no evidence of airway distress observed.  Chest and Lung Exam Inspection Movements - Normal and Symmetrical. Accessory muscles - No use of accessory muscles in breathing. Palpation Palpation of the chest reveals - Non-tender. Auscultation Breath sounds - Normal and Clear.  Cardiovascular Auscultation Rhythm - Regular. Murmurs & Other Heart Sounds - Auscultation of the heart reveals - No Murmurs and No Systolic Clicks.  Abdomen Inspection Inspection of the abdomen reveals - No Visible peristalsis and No Abnormal pulsations. Umbilicus - No Bleeding, No Urine drainage. Palpation/Percussion Palpation and Percussion of the abdomen reveal - Soft, Non Tender, No Rebound tenderness, No Rigidity (guarding) and No Cutaneous hyperesthesia. Note: Abdomen soft. Not severely distended. No distasis recti. No umbilical or other anterior abdominal wall hernias  Female Genitourinary Sexual Maturity Tanner 5 - Adult hair pattern. Note: Obvious left lateral groin bulging that comes more medially consistent with inguinal hernia. Worse with Valsalva. No lymphadenopathy. No strong evidence of recurrent right inguinal hernia. Mildly sensitive there. No vaginal bleeding nor discharge  Peripheral Vascular Upper Extremity Inspection - Left - No Cyanotic nailbeds, Not Ischemic. Right - No Cyanotic nailbeds, Not Ischemic.  Neurologic Neurologic evaluation reveals -normal attention span and ability to concentrate, able to name objects and  repeat phrases. Appropriate fund of knowledge , normal sensation and normal coordination. Mental Status Affect - not angry, not paranoid. Cranial Nerves-Normal Bilaterally. Gait-Normal.  Neuropsychiatric Mental status exam performed with findings of-able to articulate well with normal speech/language, rate, volume and coherence, thought content normal with ability to perform basic computations and apply abstract reasoning and no evidence of hallucinations, delusions, obsessions or homicidal/suicidal ideation.  Musculoskeletal Global Assessment Spine, Ribs and Pelvis - no instability, subluxation or laxity. Right Upper Extremity - no instability, subluxation or laxity.  Lymphatic Head & Neck  General Head & Neck Lymphatics: Bilateral - Description - No Localized lymphadenopathy. Axillary  General Axillary Region: Bilateral - Description - No Localized lymphadenopathy. Femoral & Inguinal  Generalized Femoral & Inguinal Lymphatics: Left - Description - No Localized lymphadenopathy. Right - Description - No Localized lymphadenopathy.    Assessment & Plan Julie Hector MD; 08/03/2018 11:14 AM)  LEFT INGUINAL HERNIA (K40.90) Impression: Classic history for inguinal hernia with history of incarceration other side. Physical exam diverting left inguinal hernia.  Think she would benefit from surgical repair. I did offer to see Dr. Harlow Asa again, but she was happy with a laparoscopic approach by me since I could see her sooner as well. Laparoscopic exploration and repair of hernias found. They would like to try an time this so that they minimize his time off work and vacation time as well. Hopefully before the end of the year. We will work to coordinate a convenient time.   PREOP - ING HERNIA - ENCOUNTER FOR PREOPERATIVE EXAMINATION FOR GENERAL SURGICAL PROCEDURE (Z01.818)  Current Plans You are being scheduled for surgery- Our schedulers will call you.  You should hear from our  office's scheduling department within 5 working days about the location, date, and time of surgery. We try to make accommodations for patient's preferences in scheduling surgery, but sometimes  the OR schedule or the surgeon's schedule prevents [place] from making those accommodations.  If you have not heard from our office [phone]) in 5 working days, call the office and ask for your surgeon's nurse.  If you have other questions about your diagnosis, plan, or surgery, call the office and ask for your surgeon's nurse.  Written instructions provided The anatomy & physiology of the abdominal wall and pelvic floor was discussed. The pathophysiology of hernias in the inguinal and pelvic region was discussed. Natural history risks such as progressive enlargement, pain, incarceration, and strangulation was discussed. Contributors to complications such as smoking, obesity, diabetes, prior surgery, etc were discussed.  I feel the risks of no intervention will lead to serious problems that outweigh the operative risks; therefore, I recommended surgery to reduce and repair the hernia. I explained laparoscopic techniques with possible need for an open approach. I noted usual use of mesh to patch and/or buttress hernia repair  Risks such as bleeding, infection, abscess, need for further treatment, heart attack, death, and other risks were discussed. I noted a good likelihood this will help address the problem. Goals of post-operative recovery were discussed as well. Possibility that this will not correct all symptoms was explained. I stressed the importance of low-impact activity, aggressive pain control, avoiding constipation, & not pushing through pain to minimize risk of post-operative chronic pain or injury. Possibility of reherniation was discussed. We will work to minimize complications.  An educational handout further explaining the pathology & treatment options was given as well. Questions  were answered. The patient expresses understanding & wishes to proceed with surgery.  Pt Education - Pamphlet Given - Laparoscopic Hernia Repair: discussed with patient and provided information. Pt Education - CCS Pain Control (Diva Lemberger) Pt Education - [facility] Hernia Post-Op HCI (Edythe Riches): discussed with patient and provided information. Pt Education - [facility] Mesh education: discussed with patient and provided information.  Julie Hector, MD, FACS, MASCRS Gastrointestinal and Minimally Invasive Surgery    [address], [place], [place] [address] [phone] Main / Paging [phone] Fax

## 2018-09-10 NOTE — Progress Notes (Signed)
[facility] cardiology Dr Harrell Gave End 12-31-17 epic   EKG, CXR  11-24-17 epic   Stress test 12-08-17 epic

## 2018-09-10 NOTE — Patient Instructions (Signed)
Julie Cross  09/10/2018   Your procedure is scheduled on: 09-22-18   Report to [facility] Main  Entrance    Report to admitting at 6:30AM    Call this number if you have problems the morning of surgery [phone]     Remember: NO SOLID FOOD AFTER MIDNIGHT THE NIGHT PRIOR TO SURGERY. NOTHING BY MOUTH EXCEPT CLEAR LIQUIDS UNTIL 3 HOURS PRIOR TO SCHEDULED SURGERY. PLEASE FINISH ENSURE DRINK PER SURGEON ORDER 3 HOURS PRIOR TO SCHEDULED SURGERY TIME WHICH NEEDS TO BE COMPLETED AT ___5:30AM_______. BRUSH YOUR TEETH MORNING OF SURGERY AND RINSE YOUR MOUTH OUT, NO CHEWING GUM CANDY OR MINTS.     CLEAR LIQUID DIET   Foods Allowed                                                                     Foods Excluded  Coffee and tea, regular and decaf                             liquids that you cannot  Plain Jell-O in any flavor                                             see through such as: Fruit ices (not with fruit pulp)                                     milk, soups, orange juice  Iced Popsicles                                    All solid food Carbonated beverages, regular and diet                                    Cranberry, grape and apple juices Sports drinks like Gatorade Lightly seasoned clear broth or consume(fat free) Sugar, honey syrup  Sample Menu Breakfast                                Lunch                                     Supper Cranberry juice                    Beef broth                            Chicken broth Jell-O  Grape juice                           Apple juice Coffee or tea                        Jell-O                                      Popsicle                                                Coffee or tea                        Coffee or tea  _____________________________________________________________________      Take these medicines the morning of surgery with A SIP OF WATER:  clonazepam if needed, levothyroxine, tylenol if needed                                You may not have any metal on your body including hair pins and              piercings  Do not wear jewelry, make-up, lotions, powders or perfumes, deodorant             Do not wear nail polish.  Do not shave  48 hours prior to surgery.               Do not bring valuables to the hospital. [place].  Contacts, dentures or bridgework may not be worn into surgery.      Patients discharged the day of surgery will not be allowed to drive home.  Name and phone number of your driver:  Special Instructions: N/A              Please read over the following fact sheets you were given: _____________________________________________________________________             [facility] - Preparing for Surgery Before surgery, you can play an important role.  Because skin is not sterile, your skin needs to be as free of germs as possible.  You can reduce the number of germs on your skin by washing with CHG (chlorahexidine gluconate) soap before surgery.  CHG is an antiseptic cleaner which kills germs and bonds with the skin to continue killing germs even after washing. Please DO NOT use if you have an allergy to CHG or antibacterial soaps.  If your skin becomes reddened/irritated stop using the CHG and inform your nurse when you arrive at Short Stay. Do not shave (including legs and underarms) for at least 48 hours prior to the first CHG shower.  You may shave your face/neck. Please follow these instructions carefully:  1.  Shower with CHG Soap the night before surgery and the  morning of Surgery.  2.  If you choose to wash your hair, wash your hair first as usual with your  normal  shampoo.  3.  After you shampoo, rinse your hair and body thoroughly to  remove the  shampoo.                           4.  Use CHG as you would any other liquid soap.  You can apply chg  directly  to the skin and wash                       Gently with a scrungie or clean washcloth.  5.  Apply the CHG Soap to your body ONLY FROM THE NECK DOWN.   Do not use on face/ open                           Wound or open sores. Avoid contact with eyes, ears mouth and genitals (private parts).                       Wash face,  Genitals (private parts) with your normal soap.             6.  Wash thoroughly, paying special attention to the area where your surgery  will be performed.  7.  Thoroughly rinse your body with warm water from the neck down.  8.  DO NOT shower/wash with your normal soap after using and rinsing off  the CHG Soap.                9.  Pat yourself dry with a clean towel.            10.  Wear clean pajamas.            11.  Place clean sheets on your bed the night of your first shower and do not  sleep with pets. Day of Surgery : Do not apply any lotions/deodorants the morning of surgery.  Please wear clean clothes to the hospital/surgery center.  FAILURE TO FOLLOW THESE INSTRUCTIONS MAY RESULT IN THE CANCELLATION OF YOUR SURGERY PATIENT SIGNATURE_________________________________  NURSE SIGNATURE__________________________________  ________________________________________________________________________

## 2018-09-13 ENCOUNTER — Encounter (HOSPITAL_COMMUNITY): Payer: Self-pay

## 2018-09-13 ENCOUNTER — Encounter (HOSPITAL_COMMUNITY)
Admission: RE | Admit: 2018-09-13 | Discharge: 2018-09-13 | Disposition: A | Discharge status: Home / Self Care | Payer: 59 | Source: Ambulatory Visit | Attending: Surgery | Admitting: Surgery

## 2018-09-13 ENCOUNTER — Other Ambulatory Visit: Payer: Self-pay

## 2018-09-13 DIAGNOSIS — Z01812 Encounter for preprocedural laboratory examination: Secondary | ICD-10-CM | POA: Diagnosis present

## 2018-09-13 HISTORY — DX: Bilateral inguinal hernia, without obstruction or gangrene, recurrent: K40.21

## 2018-09-13 LAB — CBC
HCT: 43.2 % (ref 36.0–46.0)
HEMOGLOBIN: 13.3 g/dL (ref 12.0–15.0)
MCH: 27.4 pg (ref 26.0–34.0)
MCHC: 30.8 g/dL (ref 30.0–36.0)
MCV: 88.9 fL (ref 80.0–100.0)
NRBC: 0 % (ref 0.0–0.2)
Platelets: 277 10*3/uL (ref 150–400)
RBC: 4.86 MIL/uL (ref 3.87–5.11)
RDW: 12.8 % (ref 11.5–15.5)
WBC: 5 10*3/uL (ref 4.0–10.5)

## 2018-09-21 NOTE — Anesthesia Preprocedure Evaluation (Addendum)
Anesthesia Evaluation  Patient identified by MRN, date of birth, ID band Patient awake    Reviewed: Allergy & Precautions, NPO status , Patient's Chart, lab work & pertinent test results  History of Anesthesia Complications (+) PONV and history of anesthetic complications  Airway Mallampati: II  TM Distance: >3 FB Neck ROM: Full    Dental no notable dental hx. (+) Dental Advisory Given   Pulmonary neg pulmonary ROS, former smoker,    Pulmonary exam normal        Cardiovascular negative cardio ROS Normal cardiovascular exam  Study Highlights     Nuclear stress EF: 74%.  Blood pressure demonstrated a normal response to exercise.  There was no ST segment deviation noted during stress.  The study is normal.  This is a low risk study.  The left ventricular ejection fraction is hyperdynamic (>65%).   Normal resting and stress perfusion. No ischemia or infarction EF 74%      Neuro/Psych PSYCHIATRIC DISORDERS Anxiety negative neurological ROS     GI/Hepatic negative GI ROS, Neg liver ROS, GERD  ,  Endo/Other  negative endocrine ROSHypothyroidism   Renal/GU negative Renal ROS  negative genitourinary   Musculoskeletal negative musculoskeletal ROS (+)   Abdominal   Peds negative pediatric ROS (+)  Hematology negative hematology ROS (+)   Anesthesia Other Findings   Reproductive/Obstetrics negative OB ROS                            Anesthesia Physical  Anesthesia Plan  ASA: II  Anesthesia Plan: General   Post-op Pain Management:    Induction: Intravenous  PONV Risk Score and Plan: 4 or greater and Ondansetron, Dexamethasone, Scopolamine patch - Pre-op and Diphenhydramine  Airway Management Planned: Oral ETT  Additional Equipment:   Intra-op Plan:   Post-operative Plan: Extubation in OR  Informed Consent: I have reviewed the patients History and Physical, chart,  labs and discussed the procedure including the risks, benefits and alternatives for the proposed anesthesia with the patient or authorized representative who has indicated his/her understanding and acceptance.   Dental advisory given  Plan Discussed with: CRNA, Anesthesiologist and Surgeon  Anesthesia Plan Comments:        Anesthesia Quick Evaluation

## 2018-09-22 ENCOUNTER — Ambulatory Visit (HOSPITAL_COMMUNITY)
Admission: RE | Admit: 2018-09-22 | Discharge: 2018-09-22 | Disposition: A | Discharge status: Home / Self Care | Payer: 59 | Source: Ambulatory Visit | Attending: Surgery | Admitting: Surgery

## 2018-09-22 ENCOUNTER — Encounter (HOSPITAL_COMMUNITY)
Admission: RE | Disposition: A | Discharge status: Home / Self Care | Payer: Self-pay | Source: Ambulatory Visit | Attending: Surgery

## 2018-09-22 ENCOUNTER — Ambulatory Visit (HOSPITAL_COMMUNITY): Payer: 59 | Admitting: Anesthesiology

## 2018-09-22 ENCOUNTER — Encounter (HOSPITAL_COMMUNITY): Payer: Self-pay | Admitting: Anesthesiology

## 2018-09-22 DIAGNOSIS — Z87891 Personal history of nicotine dependence: Secondary | ICD-10-CM | POA: Insufficient documentation

## 2018-09-22 DIAGNOSIS — K4190 Unilateral femoral hernia, without obstruction or gangrene, not specified as recurrent: Secondary | ICD-10-CM

## 2018-09-22 DIAGNOSIS — K4090 Unilateral inguinal hernia, without obstruction or gangrene, not specified as recurrent: Secondary | ICD-10-CM | POA: Diagnosis present

## 2018-09-22 DIAGNOSIS — K5900 Constipation, unspecified: Secondary | ICD-10-CM | POA: Diagnosis not present

## 2018-09-22 HISTORY — PX: INSERTION OF MESH: SHX5868

## 2018-09-22 HISTORY — PX: INGUINAL HERNIA REPAIR: SHX194

## 2018-09-22 SURGERY — REPAIR, HERNIA, INGUINAL, LAPAROSCOPIC
Anesthesia: General | Site: Groin

## 2018-09-22 MED ORDER — FENTANYL CITRATE (PF) 250 MCG/5ML IJ SOLN
INTRAMUSCULAR | Status: AC
  Filled 2018-09-22: qty 5

## 2018-09-22 MED ORDER — MIDAZOLAM HCL 2 MG/2ML IJ SOLN
INTRAMUSCULAR | Status: AC
  Filled 2018-09-22: qty 2

## 2018-09-22 MED ORDER — LACTATED RINGERS IV SOLN
INTRAVENOUS | Status: DC | PRN
  Administered 2018-09-22: 07:00:00 via INTRAVENOUS

## 2018-09-22 MED ORDER — TRAMADOL HCL 50 MG PO TABS: 50.0000 mg | ORAL_TABLET | Freq: Four times a day (QID) | ORAL | 0 refills | Status: DC | PRN

## 2018-09-22 MED ORDER — EPHEDRINE SULFATE-NACL 50-0.9 MG/10ML-% IV SOSY
PREFILLED_SYRINGE | INTRAVENOUS | Status: DC | PRN
  Administered 2018-09-22 (×2): 5 mg via INTRAVENOUS

## 2018-09-22 MED ORDER — BUPIVACAINE LIPOSOME 1.3 % IJ SUSP
20.0000 mL | Freq: Once | INTRAMUSCULAR | Status: AC
  Administered 2018-09-22: 20 mL
  Filled 2018-09-22: qty 20

## 2018-09-22 MED ORDER — LIDOCAINE 2% (20 MG/ML) 5 ML SYRINGE
INTRAMUSCULAR | Status: DC | PRN
  Administered 2018-09-22: 100 mg via INTRAVENOUS

## 2018-09-22 MED ORDER — EPHEDRINE 5 MG/ML INJ
INTRAVENOUS | Status: AC
  Filled 2018-09-22: qty 10

## 2018-09-22 MED ORDER — FENTANYL CITRATE (PF) 100 MCG/2ML IJ SOLN
INTRAMUSCULAR | Status: AC
  Filled 2018-09-22: qty 2

## 2018-09-22 MED ORDER — CHLORHEXIDINE GLUCONATE CLOTH 2 % EX PADS: 6.0000 | MEDICATED_PAD | Freq: Once | CUTANEOUS | Status: DC

## 2018-09-22 MED ORDER — DEXAMETHASONE SODIUM PHOSPHATE 10 MG/ML IJ SOLN
INTRAMUSCULAR | Status: DC | PRN
  Administered 2018-09-22: 10 mg via INTRAVENOUS

## 2018-09-22 MED ORDER — DIPHENHYDRAMINE HCL 50 MG/ML IJ SOLN
INTRAMUSCULAR | Status: DC | PRN
  Administered 2018-09-22: 12.5 mg via INTRAVENOUS

## 2018-09-22 MED ORDER — BUPIVACAINE HCL (PF) 0.25 % IJ SOLN
INTRAMUSCULAR | Status: AC
  Filled 2018-09-22: qty 30

## 2018-09-22 MED ORDER — PROPOFOL 10 MG/ML IV BOLUS
INTRAVENOUS | Status: AC
  Filled 2018-09-22: qty 20

## 2018-09-22 MED ORDER — LIDOCAINE 2% (20 MG/ML) 5 ML SYRINGE
INTRAMUSCULAR | Status: DC | PRN
  Administered 2018-09-22: 1.5 mg/kg/h via INTRAVENOUS

## 2018-09-22 MED ORDER — LIDOCAINE HCL 2 % IJ SOLN
INTRAMUSCULAR | Status: AC
  Filled 2018-09-22: qty 20

## 2018-09-22 MED ORDER — KETAMINE HCL 10 MG/ML IJ SOLN
INTRAMUSCULAR | Status: DC | PRN
  Administered 2018-09-22: 40 mg via INTRAVENOUS

## 2018-09-22 MED ORDER — LACTATED RINGERS IR SOLN
Status: DC | PRN
  Administered 2018-09-22: 1000 mL

## 2018-09-22 MED ORDER — DIPHENHYDRAMINE HCL 50 MG/ML IJ SOLN
INTRAMUSCULAR | Status: AC
  Filled 2018-09-22: qty 1

## 2018-09-22 MED ORDER — TRAMADOL HCL 50 MG PO TABS
50.0000 mg | ORAL_TABLET | Freq: Once | ORAL | Status: AC
  Administered 2018-09-22: 50 mg via ORAL

## 2018-09-22 MED ORDER — DEXAMETHASONE SODIUM PHOSPHATE 10 MG/ML IJ SOLN
INTRAMUSCULAR | Status: AC
  Filled 2018-09-22: qty 1

## 2018-09-22 MED ORDER — ROCURONIUM BROMIDE 10 MG/ML (PF) SYRINGE
PREFILLED_SYRINGE | INTRAVENOUS | Status: DC | PRN
  Administered 2018-09-22: 10 mg via INTRAVENOUS
  Administered 2018-09-22: 70 mg via INTRAVENOUS

## 2018-09-22 MED ORDER — CEFAZOLIN SODIUM-DEXTROSE 2-4 GM/100ML-% IV SOLN
2.0000 g | INTRAVENOUS | Status: AC
  Administered 2018-09-22: 2 g via INTRAVENOUS
  Filled 2018-09-22: qty 100

## 2018-09-22 MED ORDER — KETAMINE HCL 10 MG/ML IJ SOLN
INTRAMUSCULAR | Status: AC
  Filled 2018-09-22: qty 1

## 2018-09-22 MED ORDER — ONDANSETRON HCL 4 MG/2ML IJ SOLN
INTRAMUSCULAR | Status: AC
  Filled 2018-09-22: qty 2

## 2018-09-22 MED ORDER — LACTATED RINGERS IV SOLN
Freq: Once | INTRAVENOUS | Status: AC
  Administered 2018-09-22: 07:00:00 via INTRAVENOUS

## 2018-09-22 MED ORDER — ONDANSETRON HCL 4 MG/2ML IJ SOLN
INTRAMUSCULAR | Status: DC | PRN
  Administered 2018-09-22: 4 mg via INTRAVENOUS

## 2018-09-22 MED ORDER — SUCCINYLCHOLINE CHLORIDE 200 MG/10ML IV SOSY
PREFILLED_SYRINGE | INTRAVENOUS | Status: AC
  Filled 2018-09-22: qty 10

## 2018-09-22 MED ORDER — SUGAMMADEX SODIUM 200 MG/2ML IV SOLN
INTRAVENOUS | Status: DC | PRN
  Administered 2018-09-22: 150 mg via INTRAVENOUS

## 2018-09-22 MED ORDER — FENTANYL CITRATE (PF) 100 MCG/2ML IJ SOLN
25.0000 ug | INTRAMUSCULAR | Status: DC | PRN
  Administered 2018-09-22 (×2): 50 ug via INTRAVENOUS

## 2018-09-22 MED ORDER — 0.9 % SODIUM CHLORIDE (POUR BTL) OPTIME
TOPICAL | Status: DC | PRN
  Administered 2018-09-22: 1000 mL

## 2018-09-22 MED ORDER — MIDAZOLAM HCL 2 MG/2ML IJ SOLN
INTRAMUSCULAR | Status: DC | PRN
  Administered 2018-09-22: 2 mg via INTRAVENOUS

## 2018-09-22 MED ORDER — BUPIVACAINE HCL (PF) 0.25 % IJ SOLN
INTRAMUSCULAR | Status: DC | PRN
  Administered 2018-09-22: 50 mL

## 2018-09-22 MED ORDER — FENTANYL CITRATE (PF) 250 MCG/5ML IJ SOLN
INTRAMUSCULAR | Status: DC | PRN
  Administered 2018-09-22 (×3): 50 ug via INTRAVENOUS
  Administered 2018-09-22: 100 ug via INTRAVENOUS

## 2018-09-22 MED ORDER — CELECOXIB 200 MG PO CAPS
200.0000 mg | ORAL_CAPSULE | ORAL | Status: AC
  Administered 2018-09-22: 200 mg via ORAL
  Filled 2018-09-22: qty 1

## 2018-09-22 MED ORDER — STERILE WATER FOR IRRIGATION IR SOLN
Status: DC | PRN
  Administered 2018-09-22: 1000 mL

## 2018-09-22 MED ORDER — ROCURONIUM BROMIDE 10 MG/ML (PF) SYRINGE
PREFILLED_SYRINGE | INTRAVENOUS | Status: AC
  Filled 2018-09-22: qty 10

## 2018-09-22 MED ORDER — ACETAMINOPHEN 500 MG PO TABS
1000.0000 mg | ORAL_TABLET | ORAL | Status: AC
  Administered 2018-09-22: 1000 mg via ORAL
  Filled 2018-09-22: qty 2

## 2018-09-22 MED ORDER — LIDOCAINE 2% (20 MG/ML) 5 ML SYRINGE
INTRAMUSCULAR | Status: AC
  Filled 2018-09-22: qty 5

## 2018-09-22 MED ORDER — SUGAMMADEX SODIUM 200 MG/2ML IV SOLN
INTRAVENOUS | Status: AC
  Filled 2018-09-22: qty 2

## 2018-09-22 MED ORDER — SCOPOLAMINE 1 MG/3DAYS TD PT72
1.0000 | MEDICATED_PATCH | TRANSDERMAL | Status: DC
  Administered 2018-09-22: 1.5 mg via TRANSDERMAL
  Filled 2018-09-22: qty 1

## 2018-09-22 MED ORDER — TRAMADOL HCL 50 MG PO TABS
ORAL_TABLET | ORAL | Status: AC
  Filled 2018-09-22: qty 1

## 2018-09-22 MED ORDER — PROPOFOL 10 MG/ML IV BOLUS
INTRAVENOUS | Status: DC | PRN
  Administered 2018-09-22: 110 mg via INTRAVENOUS
  Administered 2018-09-22: 40 mg via INTRAVENOUS

## 2018-09-22 MED ORDER — PROMETHAZINE HCL 25 MG/ML IJ SOLN: 6.2500 mg | INTRAMUSCULAR | Status: DC | PRN

## 2018-09-22 SURGICAL SUPPLY — 35 items
CABLE HIGH FREQUENCY MONO STRZ (ELECTRODE) ×3 IMPLANT
CHLORAPREP W/TINT 26ML (MISCELLANEOUS) ×3 IMPLANT
COVER SURGICAL LIGHT HANDLE (MISCELLANEOUS) ×3 IMPLANT
COVER WAND RF STERILE (DRAPES) IMPLANT
DECANTER SPIKE VIAL GLASS SM (MISCELLANEOUS) ×3 IMPLANT
DEVICE SECURE STRAP 25 ABSORB (INSTRUMENTS) IMPLANT
DRAPE WARM FLUID 44X44 (DRAPE) ×3 IMPLANT
DRSG TEGADERM 2-3/8X2-3/4 SM (GAUZE/BANDAGES/DRESSINGS) ×3 IMPLANT
DRSG TEGADERM 4X4.75 (GAUZE/BANDAGES/DRESSINGS) ×3 IMPLANT
ELECT REM PT RETURN 15FT ADLT (MISCELLANEOUS) ×3 IMPLANT
GAUZE SPONGE 2X2 8PLY STRL LF (GAUZE/BANDAGES/DRESSINGS) ×2 IMPLANT
GLOVE ECLIPSE 8.0 STRL XLNG CF (GLOVE) ×3 IMPLANT
GLOVE INDICATOR 8.0 STRL GRN (GLOVE) ×3 IMPLANT
GOWN STRL REUS W/TWL XL LVL3 (GOWN DISPOSABLE) ×6 IMPLANT
IRRIG SUCT STRYKERFLOW 2 WTIP (MISCELLANEOUS) ×3
IRRIGATION SUCT STRKRFLW 2 WTP (MISCELLANEOUS) ×2 IMPLANT
KIT BASIN OR (CUSTOM PROCEDURE TRAY) ×3 IMPLANT
MESH ULTRAPRO 6X6 15CM15CM (Mesh General) ×6 IMPLANT
NEEDLE INSUFFLATION 14GA 120MM (NEEDLE) IMPLANT
PAD POSITIONING PINK XL (MISCELLANEOUS) ×3 IMPLANT
SCISSORS LAP 5X35 DISP (ENDOMECHANICALS) ×3 IMPLANT
SLEEVE ADV FIXATION 5X100MM (TROCAR) ×3 IMPLANT
SPONGE GAUZE 2X2 STER 10/PKG (GAUZE/BANDAGES/DRESSINGS) ×1
SUT MNCRL AB 4-0 PS2 18 (SUTURE) ×3 IMPLANT
SUT PDS AB 1 CT1 27 (SUTURE) ×6 IMPLANT
SUT VIC AB 2-0 SH 27 (SUTURE) ×2
SUT VIC AB 2-0 SH 27X BRD (SUTURE) ×4 IMPLANT
SUT VICRYL 0 UR6 27IN ABS (SUTURE) ×3 IMPLANT
TACKER 5MM HERNIA 3.5CML NAB (ENDOMECHANICALS) IMPLANT
TOWEL OR 17X26 10 PK STRL BLUE (TOWEL DISPOSABLE) ×3 IMPLANT
TOWEL OR NON WOVEN STRL DISP B (DISPOSABLE) ×3 IMPLANT
TRAY LAPAROSCOPIC (CUSTOM PROCEDURE TRAY) ×3 IMPLANT
TROCAR ADV FIXATION 5X100MM (TROCAR) ×3 IMPLANT
TROCAR XCEL BLUNT TIP 100MML (ENDOMECHANICALS) ×3 IMPLANT
TUBING INSUF HEATED (TUBING) ×3 IMPLANT

## 2018-09-22 NOTE — Discharge Instructions (Addendum)
HERNIA REPAIR: POST OP INSTRUCTIONS ° °###################################################################### ° °EAT °Gradually transition to a high fiber diet with a fiber supplement over the next few weeks after discharge.  Start with a pureed / full liquid diet (see below) ° °WALK °Walk an hour a day.  Control your pain to do that.   ° °CONTROL PAIN °Control pain so that you can walk, sleep, tolerate sneezing/coughing, and go up/down stairs. ° °HAVE A BOWEL MOVEMENT DAILY °Keep your bowels regular to avoid problems.  OK to try a laxative to override constipation.  OK to use an antidairrheal to slow down diarrhea.  Call if not better after 2 tries ° °CALL IF YOU HAVE PROBLEMS/CONCERNS °Call if you are still struggling despite following these instructions. °Call if you have concerns not answered by these instructions ° °###################################################################### ° ° ° °1. DIET: Follow a light bland diet the first 24 hours after arrival home, such as soup, liquids, crackers, etc.  Be sure to include lots of fluids daily.  Advance to a low fat / high fiber diet over the next few days after surgery.  Avoid fast food or heavy meals the first week as your are more likely to get nauseated.   ° °2. Take your usually prescribed home medications unless otherwise directed. ° °3. PAIN CONTROL: °a. Pain is best controlled by a usual combination of three different methods TOGETHER: °i. Ice/Heat °ii. Over the counter pain medication °iii. Prescription pain medication °b. Most patients will experience some swelling and bruising around the hernia(s) such as the bellybutton, groins, or old incisions.  Ice packs or heating pads (30-60 minutes up to 6 times a day) will help. Use ice for the first few days to help decrease swelling and bruising, then switch to heat to help relax tight/sore spots and speed recovery.  Some people prefer to use ice alone, heat alone, alternating between ice & heat.  Experiment  to what works for you.  Swelling and bruising can take several weeks to resolve.   °c. It is helpful to take an over-the-counter pain medication regularly for the first few weeks.  Choose one of the following that works best for you: °i. Naproxen (Aleve, etc)  Two 220mg tabs twice a day °ii. Ibuprofen (Advil, etc) Three 200mg tabs four times a day (every meal & bedtime) °iii. Acetaminophen (Tylenol, etc) 325-650mg four times a day (every meal & bedtime) °d. A  prescription for pain medication should be given to you upon discharge.  Take your pain medication as prescribed.  °i. If you are having problems/concerns with the prescription medicine (does not control pain, nausea, vomiting, rash, itching, etc), please call us [phone] to see if we need to switch you to a different pain medicine that will work better for you and/or control your side effect better. °ii. If you need a refill on your pain medication, please contact your pharmacy.  They will contact our office to request authorization. Prescriptions will not be filled after 5 pm or on week-ends. ° °4. Avoid getting constipated.  Between the surgery and the pain medications, it is common to experience some constipation.  Increasing fluid intake and taking a fiber supplement (such as Metamucil, Citrucel, FiberCon, MiraLax, etc) 1-2 times a day regularly will usually help prevent this problem from occurring.  A mild laxative (prune juice, Milk of Magnesia, MiraLax, etc) should be taken according to package directions if there are no bowel movements after 48 hours.   ° °5. Wash / shower every   day.  You may shower over the dressings as they are waterproof.   ° °6. Remove your waterproof bandages, skin tapes, and other bandages 5 days after surgery. You may replace a dressing/Band-Aid to cover the incision for comfort if you wish. You may leave the incisions open to air.  You may replace a dressing/Band-Aid to cover an incision for comfort if you wish.   Continue to shower over incision(s) after the dressing is off. ° °7. ACTIVITIES as tolerated:   °a. You may resume regular (light) daily activities beginning the next day--such as daily self-care, walking, climbing stairs--gradually increasing activities as tolerated.  Control your pain so that you can walk an hour a day.  If you can walk 30 minutes without difficulty, it is safe to try more intense activity such as jogging, treadmill, bicycling, low-impact aerobics, swimming, etc. °b. Save the most intensive and strenuous activity for last such as sit-ups, heavy lifting, contact sports, etc  Refrain from any heavy lifting or straining until you are off narcotics for pain control.   °c. DO NOT PUSH THROUGH PAIN.  Let pain be your guide: If it hurts to do something, don't do it.  Pain is your body warning you to avoid that activity for another week until the pain goes down. °d. You may drive when you are no longer taking prescription pain medication, you can comfortably wear a seatbelt, and you can safely maneuver your car and apply brakes. °e. You may have sexual intercourse when it is comfortable.  ° °8. FOLLOW UP in our office °a. Please call [facility] at [phone] to set up an appointment to see your surgeon in the office for a follow-up appointment approximately 2-3 weeks after your surgery. °b. Make sure that you call for this appointment the day you arrive home to insure a convenient appointment time. ° °9.  If you have disability of FMLA / Family leave forms, please bring the forms to the office for processing.  (do not give to your surgeon). ° °WHEN TO CALL US [phone]: °1. Poor pain control °2. Reactions / problems with new medications (rash/itching, nausea, etc)  °3. Fever over 101.5 F (38.5 C) °4. Inability to urinate °5. Nausea and/or vomiting °6. Worsening swelling or bruising °7. Continued bleeding from incision. °8. Increased pain, redness, or drainage from the incision ° ° The clinic staff is  available to answer your questions during regular business hours (8:30am-5pm).  Please don’t hesitate to call and ask to speak to one of our nurses for clinical concerns.  ° If you have a medical emergency, go to the nearest emergency room or call 911. ° A surgeon from [facility] is always on call at the hospitals in [place] ° °[facility] °[address], [address], [place], [place]  [address] ? ° [address] [address], [place], [place]   [address] °MAIN: [phone] ? TOLL FREE: [phone] ? FAX: [phone] °[email or web address] ° °

## 2018-09-22 NOTE — H&P (Signed)
Julie Cross  DOB: [birth year]  Patient Care Team: Mayra Neer, MD as PCP - General (Family Medicine) End, Harrell Gave, MD as PCP - Cardiology (Cardiology) Michael Boston, MD as Consulting Physician (General Surgery)  ` Patient sent for surgical consultation at the request of Dr Temple Pacini  Chief Complaint: Left groin discomfort. Possible hernia. ` ` The patient is a pleasant woman with a history of prior right inguinal hernia repair. Initially symptoms are vague and then she presented with incarcerated hernia that required urgent repair. This was 20 years ago when they were done in this state. She is concerned that she has symptoms on the other side and she may have the left one. Saw her primary care physician. Inguinal hernia suspected. Surgical consultation requested. Feels intermittent lump/swelling in her left groin especially when she is up and active. Not always there. Cannot be definitely detected the primary care office but history suspicious.  Patient comes in today with her husband. She notes she feels a lump and discomfort especially when she is out walking. Usually worse by the end of the 4 mile walk. Also allergies with her grandchildren. Usually she lies down she feels things reduced down and feel better. No problems with urination. She does have some constipation issues and moves her bowels maybe twice a week. Metamucil. No bleeding. No abdominal surgery. 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 Harlow Asa. Sent to me given expertise and laparoscopic surgery and sooner clinic time since they are hoping to address this before the end of the year.  No new events.  Ready for surgery  (Review of systems as stated in this history (HPI) or in the review of systems. Otherwise all other 12 point ROS are negative) ` ` `   Allergies ([name], LPN; 5/63/8937 34:28 AM) Gabapentin  *CHEMICALS* Allergies Reconciled  Medication History ([name], LPN; 7/68/1157 26:20 AM) Metamucil Plus Calcium (Oral) Active.    Vitals ([name] LPN; 3/55/9741 63:84 AM) 08/03/2018 10:34 AM Weight: 162.5 lb Height: 62in Body Surface Area: 1.75 m Body Mass Index: 29.72 kg/m  Temp.: 98.63F  Pulse: 64 (Regular)  Resp.: 18 (Unlabored)  P.OX: 98% (Room air) BP: 110/82 (Sitting, Left Arm, Standard)      Physical Exam [name] MD; 08/03/2018 11:17 AM)  General Mental Status-Alert. General Appearance-Not in acute distress, Not Sickly. Orientation-Oriented X3. Hydration-Well hydrated. Voice-Normal.  Integumentary Global Assessment Upon inspection and palpation of skin surfaces of the - Axillae: non-tender, no inflammation or ulceration, no drainage. and Distribution of scalp and body hair is normal. General Characteristics Temperature - normal warmth is noted.  Head and Neck Head-normocephalic, atraumatic with no lesions or palpable masses. Face Global Assessment - atraumatic, no absence of expression. Neck Global Assessment - no abnormal movements, no bruit auscultated on the right, no bruit auscultated on the left, no decreased range of motion, non-tender. Trachea-midline. Thyroid Gland Characteristics - non-tender.  Eye Eyeball - Left-Extraocular movements intact, No Nystagmus. Eyeball - Right-Extraocular movements intact, No Nystagmus. Cornea - Left-No Hazy. Cornea - Right-No Hazy. Sclera/Conjunctiva - Left-No scleral icterus, No Discharge. Sclera/Conjunctiva - Right-No scleral icterus, No Discharge. Pupil - Left-Direct reaction to light normal. Pupil - Right-Direct reaction to light normal.  ENMT Ears Pinna - Left - no drainage observed, no generalized tenderness observed. Right - no drainage observed, no generalized tenderness observed. Nose and Sinuses External Inspection  of the Nose - no destructive lesion observed. Inspection  of the nares - Left - quiet respiration. Right - quiet respiration. Mouth and Throat Lips - Upper Lip - no fissures observed, no pallor noted. Lower Lip - no fissures observed, no pallor noted. Nasopharynx - no discharge present. Oral Cavity/Oropharynx - Tongue - no dryness observed. Oral Mucosa - no cyanosis observed. Hypopharynx - no evidence of airway distress observed.  Chest and Lung Exam Inspection Movements - Normal and Symmetrical. Accessory muscles - No use of accessory muscles in breathing. Palpation Palpation of the chest reveals - Non-tender. Auscultation Breath sounds - Normal and Clear.  Cardiovascular Auscultation Rhythm - Regular. Murmurs & Other Heart Sounds - Auscultation of the heart reveals - No Murmurs and No Systolic Clicks.  Abdomen Inspection Inspection of the abdomen reveals - No Visible peristalsis and No Abnormal pulsations. Umbilicus - No Bleeding, No Urine drainage. Palpation/Percussion Palpation and Percussion of the abdomen reveal - Soft, Non Tender, No Rebound tenderness, No Rigidity (guarding) and No Cutaneous hyperesthesia. Note: Abdomen soft. Not severely distended. No distasis recti. No umbilical or other anterior abdominal wall hernias  Female Genitourinary Sexual Maturity Tanner 5 - Adult hair pattern. Note: Obvious left lateral groin bulging that comes more medially consistent with inguinal hernia. Worse with Valsalva. No lymphadenopathy. No strong evidence of recurrent right inguinal hernia. Mildly sensitive there. No vaginal bleeding nor discharge  Peripheral Vascular Upper Extremity Inspection - Left - No Cyanotic nailbeds, Not Ischemic. Right - No Cyanotic nailbeds, Not Ischemic.  Neurologic Neurologic evaluation reveals -normal attention span and ability to concentrate, able to name objects and repeat phrases. Appropriate fund of knowledge , normal sensation and normal  coordination. Mental Status Affect - not angry, not paranoid. Cranial Nerves-Normal Bilaterally. Gait-Normal.  Neuropsychiatric Mental status exam performed with findings of-able to articulate well with normal speech/language, rate, volume and coherence, thought content normal with ability to perform basic computations and apply abstract reasoning and no evidence of hallucinations, delusions, obsessions or homicidal/suicidal ideation.  Musculoskeletal Global Assessment Spine, Ribs and Pelvis - no instability, subluxation or laxity. Right Upper Extremity - no instability, subluxation or laxity.  Lymphatic Head & Neck  General Head & Neck Lymphatics: Bilateral - Description - No Localized lymphadenopathy. Axillary  General Axillary Region: Bilateral - Description - No Localized lymphadenopathy. Femoral & Inguinal  Generalized Femoral & Inguinal Lymphatics: Left - Description - No Localized lymphadenopathy. Right - Description - No Localized lymphadenopathy.    Assessment & Plan LEFT INGUINAL HERNIA (K40.90) Impression: Classic history for inguinal hernia with history of incarceration other side. Physical exam c/w left inguinal hernia.  Think she would benefit from surgical repair. I did offer to see Dr. Harlow Asa again, but she was happy with a laparoscopic approach by me since I could see her sooner as well. Laparoscopic exploration and repair of hernias found. They would like to try an time this so that they minimize his time off work and vacation time as well. Hopefully before the end of the year. We will work to coordinate a convenient time.  The anatomy & physiology of the abdominal wall and pelvic floor was discussed. The pathophysiology of hernias in the inguinal and pelvic region was discussed. Natural history risks such as progressive enlargement, pain, incarceration, and strangulation was discussed. Contributors to complications such as smoking, obesity,  diabetes, prior surgery, etc were discussed.  I feel the risks of no intervention will lead to serious problems that outweigh the operative risks; therefore, I recommended surgery to reduce and repair the hernia. I explained laparoscopic  techniques with possible need for an open approach. I noted usual use of mesh to patch and/or buttress hernia repair  Risks such as bleeding, infection, abscess, need for further treatment, heart attack, death, and other risks were discussed. I noted a good likelihood this will help address the problem. Goals of post-operative recovery were discussed as well. Possibility that this will not correct all symptoms was explained. I stressed the importance of low-impact activity, aggressive pain control, avoiding constipation, & not pushing through pain to minimize risk of post-operative chronic pain or injury. Possibility of reherniation was discussed. We will work to minimize complications.  An educational handout further explaining the pathology & treatment options was given as well. Questions were answered. The patient expresses understanding & wishes to proceed with surgery.   [name], MD, FACS, MASCRS Gastrointestinal and Minimally Invasive Surgery    [address] [place], [place] [address] [phone] Main / Paging [phone] Fax

## 2018-09-22 NOTE — Anesthesia Postprocedure Evaluation (Signed)
Anesthesia Post Note  Patient: Julie Cross  Procedure(s) Performed: LAPAROSCOPIC LEFT INGUINAL AND RIGHT FEMORAL HERNIA REPAIRS WITH MESH (Bilateral Groin) INSERTION OF MESH (N/A Groin)     Patient location during evaluation: PACU Anesthesia Type: General Level of consciousness: sedated Pain management: pain level controlled Vital Signs Assessment: post-procedure vital signs reviewed and stable Respiratory status: spontaneous breathing and respiratory function stable Cardiovascular status: stable Postop Assessment: no apparent nausea or vomiting Anesthetic complications: no    Last Vitals:  Vitals:   09/22/18 1128 09/22/18 1208  BP: 111/83 119/73  Pulse: 68 63  Resp: 16 14  Temp: (!) 36.4 C (!) 36.4 C  SpO2: 98% 98%    Last Pain:  Vitals:   09/22/18 1208  TempSrc:   PainSc: 3                  [name] DANIEL

## 2018-09-22 NOTE — Transfer of Care (Signed)
Immediate Anesthesia Transfer of Care Note  Patient: Julie Cross  Procedure(s) Performed: LAPAROSCOPIC LEFT INGUINAL AND RIGHT FEMORAL HERNIA REPAIRS WITH MESH (Bilateral Groin) INSERTION OF MESH (N/A Groin)  Patient Location: PACU  Anesthesia Type:General  Level of Consciousness: drowsy  Airway & Oxygen Therapy: Patient Spontanous Breathing and Patient connected to face mask oxygen  Post-op Assessment: Report given to RN and Post -op Vital signs reviewed and stable  Post vital signs: Reviewed and stable  Last Vitals:  Vitals Value Taken Time  BP    Temp    Pulse 80 09/22/2018 10:31 AM  Resp 12 09/22/2018 10:31 AM  SpO2 100 % 09/22/2018 10:31 AM  Vitals shown include unvalidated device data.  Last Pain:  Vitals:   09/22/18 0712  TempSrc:   PainSc: 0-No pain         Complications: No apparent anesthesia complications

## 2018-09-22 NOTE — Anesthesia Procedure Notes (Signed)
Procedure Name: Intubation Date/Time: 09/22/2018 8:42 AM Performed by: Sharlette Dense, CRNA Patient Re-evaluated:Patient Re-evaluated prior to induction Oxygen Delivery Method: Circle system utilized Preoxygenation: Pre-oxygenation with 100% oxygen Induction Type: IV induction Ventilation: Mask ventilation without difficulty and Oral airway inserted - appropriate to patient size Laryngoscope Size: Sabra Heck and 2 Grade View: Grade I Tube type: Oral Tube size: 7.5 mm Number of attempts: 1 Airway Equipment and Method: Stylet Placement Confirmation: ETT inserted through vocal cords under direct vision,  positive ETCO2 and breath sounds checked- equal and bilateral Secured at: 21 cm Tube secured with: Tape Dental Injury: Teeth and Oropharynx as per pre-operative assessment

## 2018-09-22 NOTE — Op Note (Signed)
09/22/2018  10:23 AM  PATIENT:  Julie Cross  62 y.o. female  Patient Care Team: Mayra Neer, MD as PCP - General (Family Medicine) End, Harrell Gave, MD as PCP - Cardiology (Cardiology) Michael Boston, MD as Consulting Physician (General Surgery)  PRE-OPERATIVE DIAGNOSIS:  LEFT AND POSSIBLE RIGHT INGUINAL HERNIAS  POST-OPERATIVE DIAGNOSIS:    LEFT INGUINAL HERNIA RIGHT FEMORAL HERNIA  PROCEDURE:  LAPAROSCOPIC LEFT INGUINAL AND RIGHT FEMORAL HERNIA REPAIRS WITH MESH  SURGEON:  [name], MD  ASSISTANT: Vita Barley, PA-C [employer]  ANESTHESIA:     Regional ilioinguinal and genitofemoral and spermatic cord nerve blocks  General  EBL:  Total I/O In: -  Out: 50 [Blood:50].  See anesthesia record  Delay start of Pharmacological VTE agent (>24hrs) due to surgical blood loss or risk of bleeding:  no  DRAINS: NONE  SPECIMEN:  NONE  DISPOSITION OF SPECIMEN:  N/A  COUNTS:  YES  PLAN OF CARE: Discharge to home after PACU  PATIENT DISPOSITION:  PACU - hemodynamically stable.  INDICATION: Pleasant woman with history of right inguinal hernia incarcerated requiring emergency open surgery decades ago.  No strong evidence of recurrence on the right but feels an obvious lump and mass on the left side.  Examination suspicious for left inguinal hernia.  I offered laparoscopic exploration and repair of hernias found.  The anatomy & physiology of the abdominal wall and pelvic floor was discussed.  The pathophysiology of hernias in the inguinal and pelvic region was discussed.  Natural history risks such as progressive enlargement, pain, incarceration & strangulation was discussed.   Contributors to complications such as smoking, obesity, diabetes, prior surgery, etc were discussed.    I feel the risks of no intervention will lead to serious problems that outweigh the operative risks; therefore, I recommended surgery to reduce and repair the hernia.  I explained  laparoscopic techniques with possible need for an open approach.  I noted usual use of mesh to patch and/or buttress hernia repair  Risks such as bleeding, infection, abscess, need for further treatment, heart attack, death, and other risks were discussed.  I noted a good likelihood this will help address the problem.   Goals of post-operative recovery were discussed as well.  Possibility that this will not correct all symptoms was explained.  I stressed the importance of low-impact activity, aggressive pain control, avoiding constipation, & not pushing through pain to minimize risk of post-operative chronic pain or injury. Possibility of reherniation was discussed.  We will work to minimize complications.     An educational handout further explaining the pathology & treatment options was given as well.  Questions were answered.  The patient expresses understanding & wishes to proceed with surgery.  OR FINDINGS: Moderate size LEFT indirect inguinal hernia with moderate size lipoma and inguinal canal with round ligament.  No direct space, femoral, obturator hernia.  On the right side, no strong evidence of any direct inguinal hernia.  Mild laxity at internal ring but no definite recurrent indirect inguinal hernia.   Small but definite femoral hernia.  No obturator hernia.  DESCRIPTION:  The patient was identified & brought into the operating room. The patient was positioned supine with arms tucked. SCDs were active during the entire case. The patient underwent general anesthesia without any difficulty.  The abdomen was prepped and draped in a sterile fashion. The patient's bladder was emptied.  A Surgical Timeout confirmed our plan.  I made a transverse incision through the inferior umbilical fold.  I made a small transverse nick through the anterior rectus fascia contralateral to the inguinal hernia side and placed a 0-vicryl stitch through the fascia.  I placed a Hasson trocar into the preperitoneal  plane.  Entry was clean.  We induced carbon dioxide insufflation. Camera inspection revealed no injury.  I used a 103mm angled scope to bluntly free the peritoneum off the infraumbilical anterior abdominal wall.  I created enough of a preperitoneal pocket to place 63mm ports into the right & left mid-abdomen into this preperitoneal cavity.  I focused attention on the LEFT pelvis since that was the dominant hernia side.   I used blunt & focused sharp dissection to free the peritoneum off the flank and down to the pubic rim.  I freed the anteriolateral bladder wall off the anteriolateral pelvic wall, sparing midline attachments.   I located a swath of peritoneum going into a hernia fascial defect at the  internal ring consistent with  an indirect inguinal hernia..  I gradually freed the peritoneal hernia sac off safely and reduced it into the preperitoneal space.  I freed the peritoneum off the spermatic vessels & vas deferens.  I freed peritoneum off the retroperitoneum along the psoas muscle.  Round ligament cord lipoma was dissected away & removed.  I checked & assured hemostasis.  Peritoneal repair done with 2-0 Vicryl intracorporeal laparoscopic suturing to also provide a high ligation of the hernia sac  I turned attention on the opposite  RIGHT pelvis.  I did dissection in a similar, mirror-image fashion. The patient had a femoral hernia.  Mild laxity at the internal ring but no strong evidence of recurrent indirect inguinal hernia.  No direct space hernia.  No obturator hernia..   Round ligament lipoma was dissected away & removed.    I checked & assured hemostasis.  Peritoneal repair done on that side in a similar fashion with 2-0 Vicryl laparoscopic intracorporeal suturing  I chose 15x15 cm sheets of ultra-lightweight polypropylene mesh (Ultrapro), one for each side.  I cut a single sigmoid-shaped slit ~6cm from a corner of each mesh.  I placed the meshes into the preperitoneal space & laid them as  overlapping diamonds such that at the inferior points, a 6x6 cm corner flap rested in the true anterolateral pelvis, covering the obturator & femoral foramina.   I allowed the bladder to return to the pubis, this helping tuck the corners of the mesh in the anteriolateral pelvis.  The medial corners overlapped each other across midline cephalad to the pubic rim.   This provided >2 inch coverage around the hernias. Because the defects well covered and not particularly large, I did not place any tacks.   I held the hernia sacs cephalad & evacuated carbon dioxide.  I closed the fascia with absorbable suture.  I closed the skin using 4-0 monocryl stitch.  Sterile dressings were applied.   The patient was extubated & arrived in the PACU in stable condition..  I had discussed postoperative care with the patient in the holding area.  Instructions are written in the chart.  I discussed operative findings, updated the patient's status, discussed probable steps to recovery, and gave postoperative recommendations to the patient's spouse.  Recommendations were made.  Questions were answered.  He expressed understanding & appreciation.   [name], M.D., F.A.C.S. Gastrointestinal and Minimally Invasive Surgery [facility] [address] [place], [place] [phone] [phone] [address] / Paging  09/22/2018 10:23 AM

## 2018-09-23 ENCOUNTER — Encounter (HOSPITAL_COMMUNITY): Payer: Self-pay | Admitting: Surgery

## 2019-06-28 ENCOUNTER — Other Ambulatory Visit: Payer: Self-pay | Admitting: Family Medicine

## 2019-06-28 ENCOUNTER — Other Ambulatory Visit: Payer: Self-pay | Admitting: Advanced Practice Midwife

## 2019-06-28 DIAGNOSIS — Z1231 Encounter for screening mammogram for malignant neoplasm of breast: Secondary | ICD-10-CM

## 2019-07-28 ENCOUNTER — Other Ambulatory Visit: Payer: Self-pay

## 2019-07-28 ENCOUNTER — Ambulatory Visit
Admission: RE | Admit: 2019-07-28 | Discharge: 2019-07-28 | Disposition: A | Discharge status: Home / Self Care | Payer: 59 | Source: Ambulatory Visit | Attending: Family Medicine | Admitting: Family Medicine

## 2019-07-28 DIAGNOSIS — Z1231 Encounter for screening mammogram for malignant neoplasm of breast: Secondary | ICD-10-CM

## 2020-07-03 ENCOUNTER — Other Ambulatory Visit: Payer: Self-pay | Admitting: Advanced Practice Midwife

## 2020-07-03 DIAGNOSIS — Z1231 Encounter for screening mammogram for malignant neoplasm of breast: Secondary | ICD-10-CM

## 2020-07-17 ENCOUNTER — Telehealth: Payer: Self-pay | Admitting: Internal Medicine

## 2020-07-17 NOTE — Telephone Encounter (Signed)
That is fine with me.  Julie Cross

## 2020-07-17 NOTE — Telephone Encounter (Signed)
Patient requesting to switch from Dr. Saunders Revel to Dr. Harrell Gave, because she wants to be seen is [facility].

## 2020-07-18 NOTE — Telephone Encounter (Signed)
Ok by me. Thanks.

## 2020-07-19 ENCOUNTER — Ambulatory Visit: Payer: 59 | Admitting: Nurse Practitioner

## 2020-07-26 ENCOUNTER — Telehealth (INDEPENDENT_AMBULATORY_CARE_PROVIDER_SITE_OTHER): Payer: 59 | Admitting: Cardiology

## 2020-07-26 VITALS — BP 128/76 | HR 59 | Ht 62.0 in | Wt 167.2 lb

## 2020-07-26 DIAGNOSIS — E7800 Pure hypercholesterolemia, unspecified: Secondary | ICD-10-CM

## 2020-07-26 DIAGNOSIS — Z79899 Other long term (current) drug therapy: Secondary | ICD-10-CM | POA: Diagnosis not present

## 2020-07-26 DIAGNOSIS — R0789 Other chest pain: Secondary | ICD-10-CM | POA: Diagnosis not present

## 2020-07-26 DIAGNOSIS — R002 Palpitations: Secondary | ICD-10-CM | POA: Diagnosis not present

## 2020-07-26 DIAGNOSIS — Z7189 Other specified counseling: Secondary | ICD-10-CM

## 2020-07-26 NOTE — Patient Instructions (Addendum)
Medication Instructions:  Gradually increase from 2.5 mg rosuvastatin to 5 mg rosuvastatin daily. Would start with 2.5 mg every day, plus additional 2.5 mg (total of 5 mg) three times/week. If that goes well, can then gradually increase to 5 mg daily. Once you have been on 5 mg dose consistently for two months, come get your lipids checked at the office.  *If you need a refill on your cardiac medications before your next appointment, please call your pharmacy*   Lab Work: Your physician recommends that you return for lab work in 3 months ( fasting lipids)  If you have labs (blood work) drawn today and your tests are completely normal, you will receive your results only by: Marland Kitchen MyChart Message (if you have MyChart) OR . A paper copy in the mail If you have any lab test that is abnormal or we need to change your treatment, we will call you to review the results.   Testing/Procedures: None ordered     Follow-Up: At [facility], you and your health needs are our priority.  As part of our continuing mission to provide you with exceptional heart care, we have created designated Provider Care Teams.  These Care Teams include your primary Cardiologist (physician) and Advanced Practice Providers (APPs -  Physician Assistants and Nurse Practitioners) who all work together to provide you with the care you need, when you need it.  We recommend signing up for the patient portal called "MyChart".  Sign up information is provided on this After Visit Summary.  MyChart is used to connect with patients for Virtual Visits (Telemedicine).  Patients are able to view lab/test results, encounter notes, upcoming appointments, etc.  Non-urgent messages can be sent to your provider as well.   To learn more about what you can do with MyChart, go to [email or web address].    Your next appointment:   1 year(s)  The format for your next appointment:   In Person  Provider:   [name],  MD   Other Instructions Look into KardiaMobile monitor. You can send me any strips through MyChart.

## 2020-07-26 NOTE — Progress Notes (Signed)
Virtual Visit via Video Note   This visit type was conducted due to national recommendations for restrictions regarding the COVID-19 Pandemic (e.g. social distancing) in an effort to limit this patient's exposure and mitigate transmission in our community.  Due to her co-morbid illnesses, this patient is at least at moderate risk for complications without adequate follow up.  This format is felt to be most appropriate for this patient at this time.  All issues noted in this document were discussed and addressed.  A limited physical exam was performed with this format.  Please refer to the patient's chart for her consent to telehealth for [facility].   Date:  07/26/2020   ID:  Julie Cross, DOB 30-Jan-1956, MRN [id number] The patient was identified using 2 identifiers.  Patient Location: Home Provider Location: Home Office  PCP:  Mayra Neer, MD  Cardiologist:  Nelva Bush, MD  Electrophysiologist:  None   Evaluation Performed:  Follow-Up Visit  Chief Complaint:  Follow up  History of Present Illness:    Julie Cross is a 64 y.o. female with hypertension, hyperlipidemia, hypothyroidism, and GERD who has been seen previously for atypical chest pain. This is our first visit, as she was previously followed by Dr. Saunders Revel.  The patient does not have symptoms concerning for COVID-19 infection (fever, chills, cough, or new shortness of breath).   Today: We initially had trouble contacting her but were then able to reach her to perform the visit.  Had a recent episode that lasted several days, re-referred to cardiology.  August 24, woke up in the middle of the night with slow, heavy heartbeats. Had nausea as well. Lasted several hours. Rest of the day was normal, but she was fatigued. Same thing happened the following night, pulse was 40. Happened again a third night. Nausea, fatigue with each event. Saw PA on 8/27, told ECG was fine, labs all normal except for lipids. Heard  premature beats on exam, referred back to cardiology  Also recommended to increase her statin as her cholesterol is still elevated. Started 10/2019, put on a pound a day for 6 days right after starting. Also got severe knee pain that lasted for months, stabbing, worse at night. Knee pain now improved. Told by PT night pain is more commonly inflammation. Back to walking 1.5-2 miles/day.   Denies chest pain, shortness of breath at rest or with normal exertion. No PND, orthopnea, LE edema or unexpected weight gain. No syncope or palpitations.   Past Medical History:  Diagnosis Date  . Bilateral recurrent inguinal hernias   . GERD (gastroesophageal reflux disease)    weight loss has help improve  . Hyperlipidemia   . Hypothyroidism   . Migraine    not as often   . Pancreatitis    11'14- no issues now  . PONV (postoperative nausea and vomiting)   . 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 BREAST SURGERY  2006   biopsy-right-benign  . CHOLECYSTECTOMY  2007  . COLONOSCOPY WITH PROPOFOL N/A 10/07/2017   Procedure: COLONOSCOPY WITH PROPOFOL;  Surgeon: Arta Silence, MD;  Location: [facility] ENDOSCOPY;  Service: Endoscopy;  Laterality: N/A;  EMR need Polyp lifting device  . ERCP N/A 09/17/2013   Procedure: ENDOSCOPIC RETROGRADE CHOLANGIOPANCREATOGRAPHY (ERCP);  Surgeon: Missy Sabins, MD;  Location: [facility] ENDOSCOPY;  Service: Endoscopy;  Laterality: N/A;  . ERCP N/A 09/23/2013   Procedure: ENDOSCOPIC RETROGRADE CHOLANGIOPANCREATOGRAPHY (ERCP);  Surgeon: Jeryl Columbia, MD;  Location: [facility];  Service: Endoscopy;  Laterality: N/A;  . HERNIA REPAIR Right 1999  . INGUINAL HERNIA REPAIR Bilateral 09/22/2018   Procedure: LAPAROSCOPIC LEFT INGUINAL AND RIGHT FEMORAL HERNIA REPAIRS WITH MESH;  Surgeon: Michael Boston, MD;  Location: [facility] ORS;  Service: General;  Laterality: Bilateral;  . INSERTION OF MESH N/A 09/22/2018    Procedure: INSERTION OF MESH;  Surgeon: Michael Boston, MD;  Location: [facility] ORS;  Service: General;  Laterality: N/A;  . THYROID LOBECTOMY Right 09/25/2015   Procedure: RIGHT THYROID LOBECTOMY;  Surgeon: Armandina Gemma, MD;  Location: [facility] ORS;  Service: General;  Laterality: Right;  . TUBAL LIGATION  1980's     No outpatient medications have been marked as taking for the 07/26/20 encounter (Video Visit) with [name], MD.     Allergies:   Gabapentin   Social History   Tobacco Use  . Smoking status: Former Smoker    Years: 8.00    Types: Cigarettes    Quit date: 09/18/1972    Years since quitting: 47.8  . Smokeless tobacco: Never Used  Substance Use Topics  . Alcohol use: Yes    Comment: rare social occ x2 month  . Drug use: No     Family Hx: The patient's family history includes Breast cancer in her mother; CAD in an other family member; Hodgkin's lymphoma in her brother; Lung cancer in her father; Stroke in an other family member; Thyroid disease in her sister and son.  ROS:   Please see the history of present illness.    All other systems reviewed and are negative.   Prior CV studies:   The following studies were reviewed today:  Exercise MPI (12/08/17): Normal study without ischemia or scar. LVEF greater than 65%. Patient exercised 9 minutes, zero seconds achieving 99% MPHR and 10.1 METS.  Labs/Other Tests and Data Reviewed:    EKG:  An ECG dated 11/27/2017 was personally reviewed today and demonstrated:  sinus rhythm  Recent Labs: No results found for requested labs within last 8760 hours.   Recent Lipid Panel No results found for: CHOL, TRIG, HDL, CHOLHDL, LDLCALC, LDLDIRECT  Wt Readings from Last 3 Encounters:  07/26/20 167 lb 3.2 oz (75.8 kg)  09/22/18 162 lb (73.5 kg)  09/13/18 162 lb (73.5 kg)     Objective:    Vital Signs:  BP 128/76   Pulse (!) 59   Ht 5\' 2"  (1.575 m)   Wt 167 lb 3.2 oz (75.8 kg)   BMI 30.58 kg/m    VITAL SIGNS:   reviewed GEN:  no acute distress EYES:  sclerae anicteric, EOMI - Extraocular Movements Intact RESPIRATORY:  normal respiratory effort, symmetric expansion CARDIOVASCULAR:  no visible JVD SKIN:  no rash, lesions or ulcers. MUSCULOSKELETAL:  no obvious deformities. NEURO:  alert and oriented x 3, no obvious focal deficit PSYCH:  normal affect  ASSESSMENT & PLAN:    Atypical chest pain: Palpitations: -may have been PVCs based on description -we discussed monitor, but given unpredictability of events, unlikely to capture unless symptoms become more frequent -we discussed kardiamobile today as an alternative -as it occurs at rest, at night, nonexertional, with prior reassuring workup, will not evaluate for ischemic cause at this time -counseled on red flag warning signs that need immediate medical attention  Hyperlipidemia: -will gradually ramp up statin, see ramp instructions for rosuvastatin below -recheck lipids once on steady dose for 2 mos  Hypertension: -at goal today, on no  medications  CV risk counseling and prevention: -recommend heart healthy/Mediterranean diet, with whole grains, fruits, vegetable, fish, lean meats, nuts, and olive oil. Limit salt. -recommend moderate walking, 3-5 times/week for 30-50 minutes each session. Aim for at least 150 minutes.week. Goal should be pace of 3 miles/hours, or walking 1.5 miles in 30 minutes -recommend avoidance of tobacco products. Avoid excess alcohol.  COVID-19 Education: The signs and symptoms of COVID-19 were discussed with the patient and how to seek care for testing (follow up with PCP or arrange E-visit).  The importance of social distancing was discussed today.  Time:   Today, I have spent 25 minutes with the patient with telehealth technology discussing the above problems.     Medication Adjustments/Labs and Tests Ordered: Current medicines are reviewed at length with the patient today.  Concerns regarding medicines are  outlined above.   Patient Instructions  Medication Instructions:  Gradually increase from 2.5 mg rosuvastatin to 5 mg rosuvastatin daily. Would start with 2.5 mg every day, plus additional 2.5 mg (total of 5 mg) three times/week. If that goes well, can then gradually increase to 5 mg daily. Once you have been on 5 mg dose consistently for two months, come get your lipids checked at the office.  *If you need a refill on your cardiac medications before your next appointment, please call your pharmacy*   Lab Work: Your physician recommends that you return for lab work in 3 months ( fasting lipids)  If you have labs (blood work) drawn today and your tests are completely normal, you will receive your results only by: Marland Kitchen MyChart Message (if you have MyChart) OR . A paper copy in the mail If you have any lab test that is abnormal or we need to change your treatment, we will call you to review the results.   Testing/Procedures: None ordered     Follow-Up: At [facility], you and your health needs are our priority.  As part of our continuing mission to provide you with exceptional heart care, we have created designated Provider Care Teams.  These Care Teams include your primary Cardiologist (physician) and Advanced Practice Providers (APPs -  Physician Assistants and Nurse Practitioners) who all work together to provide you with the care you need, when you need it.  We recommend signing up for the patient portal called "MyChart".  Sign up information is provided on this After Visit Summary.  MyChart is used to connect with patients for Virtual Visits (Telemedicine).  Patients are able to view lab/test results, encounter notes, upcoming appointments, etc.  Non-urgent messages can be sent to your provider as well.   To learn more about what you can do with MyChart, go to [email or web address].    Your next appointment:   1 year(s)  The format for your next appointment:   In  Person  Provider:   [name], MD   Other Instructions Look into KardiaMobile monitor. You can send me any strips through MyChart.     Signed, [name], MD  07/26/2020   [facility]

## 2020-07-30 ENCOUNTER — Ambulatory Visit
Admission: RE | Admit: 2020-07-30 | Discharge: 2020-07-30 | Disposition: A | Discharge status: Home / Self Care | Payer: 59 | Source: Ambulatory Visit

## 2020-07-30 ENCOUNTER — Other Ambulatory Visit: Payer: Self-pay

## 2020-07-30 DIAGNOSIS — Z1231 Encounter for screening mammogram for malignant neoplasm of breast: Secondary | ICD-10-CM

## 2020-07-31 ENCOUNTER — Other Ambulatory Visit (HOSPITAL_COMMUNITY)
Admission: RE | Admit: 2020-07-31 | Discharge: 2020-07-31 | Disposition: A | Discharge status: Home / Self Care | Payer: 59 | Source: Ambulatory Visit | Attending: Family Medicine | Admitting: Family Medicine

## 2020-07-31 ENCOUNTER — Other Ambulatory Visit: Payer: Self-pay | Admitting: Family Medicine

## 2020-07-31 DIAGNOSIS — Z124 Encounter for screening for malignant neoplasm of cervix: Secondary | ICD-10-CM | POA: Insufficient documentation

## 2020-08-02 ENCOUNTER — Other Ambulatory Visit: Payer: Self-pay | Admitting: Family Medicine

## 2020-08-02 DIAGNOSIS — E2839 Other primary ovarian failure: Secondary | ICD-10-CM

## 2020-08-02 LAB — CYTOLOGY - PAP
Comment: NEGATIVE
Diagnosis: NEGATIVE
High risk HPV: NEGATIVE

## 2020-09-02 ENCOUNTER — Encounter: Payer: Self-pay | Admitting: Cardiology

## 2020-10-25 LAB — LIPID PANEL
Chol/HDL Ratio: 2.8 ratio (ref 0.0–4.4)
Cholesterol, Total: 203 mg/dL — ABNORMAL HIGH (ref 100–199)
HDL: 72 mg/dL (ref >39)
LDL Chol Calc (NIH): 109 mg/dL — ABNORMAL HIGH (ref 0–99)
Triglycerides: 127 mg/dL (ref 0–149)
VLDL Cholesterol Cal: 22 mg/dL (ref 5–40)

## 2020-11-08 ENCOUNTER — Ambulatory Visit
Admission: RE | Admit: 2020-11-08 | Discharge: 2020-11-08 | Disposition: A | Discharge status: Home / Self Care | Payer: 59 | Source: Ambulatory Visit | Attending: Family Medicine | Admitting: Family Medicine

## 2020-11-08 ENCOUNTER — Other Ambulatory Visit: Payer: Self-pay

## 2020-11-08 DIAGNOSIS — E2839 Other primary ovarian failure: Secondary | ICD-10-CM

## 2020-11-12 ENCOUNTER — Other Ambulatory Visit: Payer: Self-pay

## 2020-11-12 DIAGNOSIS — Z79899 Other long term (current) drug therapy: Secondary | ICD-10-CM

## 2020-11-12 DIAGNOSIS — E7800 Pure hypercholesterolemia, unspecified: Secondary | ICD-10-CM

## 2020-11-12 DIAGNOSIS — E785 Hyperlipidemia, unspecified: Secondary | ICD-10-CM

## 2020-11-12 MED ORDER — ROSUVASTATIN CALCIUM 5 MG PO TABS
10.0000 mg | ORAL_TABLET | Freq: Every day | ORAL | 6 refills | Status: DC
Start: 2020-11-12 — End: 2021-05-02

## 2021-05-02 ENCOUNTER — Other Ambulatory Visit: Payer: Self-pay | Admitting: Cardiology

## 2021-05-02 MED ORDER — ROSUVASTATIN CALCIUM 5 MG PO TABS
10.0000 mg | ORAL_TABLET | Freq: Every day | ORAL | 3 refills | Status: DC
Start: 2021-05-02 — End: 2021-09-27

## 2021-05-14 ENCOUNTER — Other Ambulatory Visit: Payer: Self-pay | Admitting: Advanced Practice Midwife

## 2021-05-14 DIAGNOSIS — Z1231 Encounter for screening mammogram for malignant neoplasm of breast: Secondary | ICD-10-CM

## 2021-08-13 ENCOUNTER — Ambulatory Visit
Admission: RE | Admit: 2021-08-13 | Discharge: 2021-08-13 | Disposition: A | Discharge status: Home / Self Care | Payer: Medicare Other | Source: Ambulatory Visit

## 2021-08-13 ENCOUNTER — Other Ambulatory Visit: Payer: Self-pay

## 2021-08-13 DIAGNOSIS — Z1231 Encounter for screening mammogram for malignant neoplasm of breast: Secondary | ICD-10-CM

## 2021-09-11 ENCOUNTER — Encounter (HOSPITAL_BASED_OUTPATIENT_CLINIC_OR_DEPARTMENT_OTHER): Payer: Self-pay

## 2021-09-27 ENCOUNTER — Other Ambulatory Visit: Payer: Self-pay

## 2021-09-27 ENCOUNTER — Ambulatory Visit (INDEPENDENT_AMBULATORY_CARE_PROVIDER_SITE_OTHER): Payer: 59 | Admitting: Cardiology

## 2021-09-27 ENCOUNTER — Encounter (HOSPITAL_BASED_OUTPATIENT_CLINIC_OR_DEPARTMENT_OTHER): Payer: Self-pay | Admitting: Cardiology

## 2021-09-27 VITALS — BP 118/70 | HR 70 | Ht 62.0 in | Wt 180.9 lb

## 2021-09-27 DIAGNOSIS — E7800 Pure hypercholesterolemia, unspecified: Secondary | ICD-10-CM

## 2021-09-27 DIAGNOSIS — R002 Palpitations: Secondary | ICD-10-CM

## 2021-09-27 DIAGNOSIS — Z7189 Other specified counseling: Secondary | ICD-10-CM | POA: Diagnosis not present

## 2021-09-27 MED ORDER — PRAVASTATIN SODIUM 20 MG PO TABS: 20.0000 mg | ORAL_TABLET | Freq: Every evening | ORAL | 11 refills | Status: DC

## 2021-09-27 NOTE — Patient Instructions (Signed)
Medication Instructions:  STOP: ROSUVASTATIN 10 MG  START: PRAVASTATIN 20 MG DAILY  *If you need a refill on your cardiac medications before your next appointment, please call your pharmacy*   Lab Work: None ordered today   Testing/Procedures: None ordered today   Follow-Up: At [facility], you and your health needs are our priority.  As part of our continuing mission to provide you with exceptional heart care, we have created designated Provider Care Teams.  These Care Teams include your primary Cardiologist (physician) and Advanced Practice Providers (APPs -  Physician Assistants and Nurse Practitioners) who all work together to provide you with the care you need, when you need it.  We recommend signing up for the patient portal called "MyChart".  Sign up information is provided on this After Visit Summary.  MyChart is used to connect with patients for Virtual Visits (Telemedicine).  Patients are able to view lab/test results, encounter notes, upcoming appointments, etc.  Non-urgent messages can be sent to your provider as well.   To learn more about what you can do with MyChart, go to [email or web address].    Your next appointment:   1 year(s)  The format for your next appointment:   In Person  Provider:   [name], MD

## 2021-09-27 NOTE — Progress Notes (Signed)
Cardiology Office Note:   Date:  09/27/2021   ID:  [name], DOB 25-Apr-1956, MRN [id number]  PCP:  Mayra Neer, MD  Cardiologist:  [name], MD   Chief Complaint:  Follow up  History of Present Illness:    Julie Cross is a 65 y.o. female with hypertension, hyperlipidemia, hypothyroidism, and GERD here for follow-up. She has been seen previously for atypical chest pain. She was previously followed by Dr. Saunders Revel.  Today: Overall, she states things are good in general. She is not as concerned with the cardiovascular symptoms that initially prompted her referral to cardiology. However, she is having bothersome issues with myalgias.  When she first began her statin medication, she had issues with severe right knee pain waking her up at night. Her knee pain resolved. In the past few months she has developed lower back and neck pains that she notes is a severity of 4-5/10.  When lying down at night she sometimes notices her heart pounding very loudly in her ears, but this does not occur often. It may rarely occur during the day.  Recently she returned from a hiking trip and noticed no exertional symptoms.   Previously during a road trip she was infected with COVID.  She denies any chest pain, or shortness of breath. No lightheadedness, headaches, syncope, orthopnea, PND, or lower extremity edema.   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 unknown date   BREAST SURGERY  11/17/2004   biopsy-right-benign   CHOLECYSTECTOMY  11/17/2005    COLONOSCOPY WITH PROPOFOL N/A 10/07/2017   Procedure: COLONOSCOPY WITH PROPOFOL;  Surgeon: Arta Silence, MD;  Location: [facility];  Service: Endoscopy;  Laterality: N/A;  EMR need Polyp lifting device   ERCP N/A 09/17/2013   Procedure: ENDOSCOPIC RETROGRADE CHOLANGIOPANCREATOGRAPHY (ERCP);  Surgeon: Missy Sabins, MD;  Location: [facility];  Service: Endoscopy;  Laterality: N/A;   ERCP N/A 09/23/2013   Procedure: ENDOSCOPIC RETROGRADE CHOLANGIOPANCREATOGRAPHY (ERCP);  Surgeon: Jeryl Columbia, MD;  Location: [facility];  Service: Endoscopy;  Laterality: N/A;   HERNIA REPAIR Right 11/17/1997   INGUINAL HERNIA REPAIR Bilateral 09/22/2018   Procedure: LAPAROSCOPIC LEFT INGUINAL AND RIGHT FEMORAL HERNIA REPAIRS WITH MESH;  Surgeon: Michael Boston, MD;  Location: [facility];  Service: General;  Laterality: Bilateral;   INSERTION OF MESH N/A 09/22/2018   Procedure: INSERTION OF MESH;  Surgeon: Michael Boston, MD;  Location: [facility];  Service: General;  Laterality: N/A;   THYROID LOBECTOMY Right 09/25/2015   Procedure: RIGHT THYROID LOBECTOMY;  Surgeon: Armandina Gemma, MD;  Location: [facility];  Service: General;  Laterality: Right;   TUBAL LIGATION  07/19/1979     Current Meds  Medication Sig   acetaminophen (TYLENOL) 500 MG tablet Take 500 mg by mouth every 6 (six) hours as needed for moderate pain or headache.   calcium carbonate (TUMS - DOSED IN MG ELEMENTAL CALCIUM) 500 MG chewable tablet Chew 2 tablets by mouth daily as needed for indigestion or heartburn.  clonazePAM (KLONOPIN) 0.5 MG tablet Take 0.5 mg by mouth daily as needed for anxiety.    ibuprofen (ADVIL) 200 MG tablet Take 200 mg by mouth every 6 (six) hours as needed.   isometheptene-acetaminophen-dichloralphenazone (MIDRIN) 65-325-100 MG capsule Take 1 capsule by mouth every 4 (four) hours as needed for migraine or headache.    levothyroxine (SYNTHROID, LEVOTHROID) 125 MCG tablet Take 125 mcg by mouth See admin instructions. Take 125 mcg  by mouth daily except do NOT take on Sunday   pravastatin (PRAVACHOL) 20 MG tablet Take 1 tablet (20 mg total) by mouth every evening.   Psyllium (METAMUCIL FIBER PO) Take 1 each by mouth See admin instructions. Mix 2 tablespoons in liquid and drink daily   [DISCONTINUED] rosuvastatin (CRESTOR) 5 MG tablet Take 2 tablets (10 mg total) by mouth daily.     Allergies:   Gabapentin   Social History   Tobacco Use   Smoking status: Former    Years: 8.00    Types: Cigarettes    Quit date: 09/18/1972    Years since quitting: 49.0   Smokeless tobacco: Never  Substance Use Topics   Alcohol use: Yes    Comment: rare social occ x2 month   Drug use: No     Family Hx: The patient's family history includes Breast cancer in her mother; CAD in an other family member; Hodgkin's lymphoma in her brother; Lung cancer in her father; Stroke in an other family member; Thyroid disease in her sister and son.  ROS:   Please see the history of present illness.    (+) Myalgias (+) Lower back pain (+) Neck pain All other systems reviewed and are negative.   Prior CV studies:   The following studies were reviewed today: Exercise MPI (12/08/17): Normal study without ischemia or scar. LVEF greater than 65%. Patient exercised 9 minutes, zero seconds achieving 99% MPHR and 10.1 METS.  Labs/Other Tests and Data Reviewed:    EKG:   EKG is personally reviewed. 09/27/2021: NSR at 70 bpm, borderline R wave progression 11/27/2017: Sinus rhythm   Recent Labs: No results found for requested labs within last 8760 hours.   Recent Lipid Panel Lab Results  Component Value Date/Time   CHOL 203 (H) 10/24/2020 09:00 AM   TRIG 127 10/24/2020 09:00 AM   HDL 72 10/24/2020 09:00 AM   CHOLHDL 2.8 10/24/2020 09:00 AM   LDLCALC 109 (H) 10/24/2020 09:00 AM    Wt Readings from Last 3 Encounters:  09/27/21 180 lb 14.4 oz (82.1 kg)  07/26/20 167 lb 3.2 oz (75.8 kg)  09/22/18 162 lb (73.5 kg)     Objective:     Vital Signs:  BP 118/70   Pulse 70   Ht 5\' 2"  (1.575 m)   Wt 180 lb 14.4 oz (82.1 kg)   BMI 33.09 kg/m    GEN: Well nourished, well developed in no acute distress HEENT: Normal, moist mucous membranes NECK: No JVD CARDIAC: regular rhythm, normal S1 and S2, no rubs or gallops. No murmur. VASCULAR: Radial and DP pulses 2+ bilaterally. No carotid bruits RESPIRATORY:  Clear to auscultation without rales, wheezing or rhonchi  ABDOMEN: Soft, non-tender, non-distended MUSCULOSKELETAL:  Ambulates independently SKIN: Warm and dry, no edema NEUROLOGIC:  Alert and oriented x 3. No focal neuro deficits noted. PSYCHIATRIC:  Normal affect    ASSESSMENT & PLAN:    Palpitations: -has been manageable, continue to monitor -counseled on red flag warning signs that need immediate medical attention  Hyperlipidemia:  Back and neck muscle pain -we discussed options today. After shared decision making, will trial pravastatin for a month. If pain improves, continue pravastatin and recheck lipids in 2 mos. If no change in pain, return to rosuvastatin  Hypertension: -at goal today, on no medications  CV risk counseling and prevention: -recommend heart healthy/Mediterranean diet, with whole grains, fruits, vegetable, fish, lean meats, nuts, and olive oil. Limit salt. -recommend moderate walking, 3-5 times/week for 30-50 minutes each session. Aim for at least 150 minutes.week. Goal should be pace of 3 miles/hours, or walking 1.5 miles in 30 minutes -recommend avoidance of tobacco products. Avoid excess alcohol.   Plan for follow-up:  1 year or sooner as needed.  Medication Adjustments/Labs and Tests Ordered: Current medicines are reviewed at length with the patient today.  Concerns regarding medicines are outlined above.   Patient Instructions  Medication Instructions:  STOP: ROSUVASTATIN 10 MG  START: PRAVASTATIN 20 MG DAILY  *If you need a refill on your cardiac medications before your next  appointment, please call your pharmacy*   Lab Work: None ordered today   Testing/Procedures: None ordered today   Follow-Up: At [facility], you and your health needs are our priority.  As part of our continuing mission to provide you with exceptional heart care, we have created designated Provider Care Teams.  These Care Teams include your primary Cardiologist (physician) and Advanced Practice Providers (APPs -  Physician Assistants and Nurse Practitioners) who all work together to provide you with the care you need, when you need it.  We recommend signing up for the patient portal called "MyChart".  Sign up information is provided on this After Visit Summary.  MyChart is used to connect with patients for Virtual Visits (Telemedicine).  Patients are able to view lab/test results, encounter notes, upcoming appointments, etc.  Non-urgent messages can be sent to your provider as well.   To learn more about what you can do with MyChart, go to [email or web address].    Your next appointment:   1 year(s)  The format for your next appointment:   In Person  Provider:   [name], MD     [facility] Stumpf,acting as a scribe for [name], MD.,have documented all relevant documentation on the behalf of [name], MD,as directed by  [name], MD while in the presence of [name], MD.  I, [name], MD, have reviewed all documentation for this visit. The documentation on 09/27/21 for the exam, diagnosis, procedures, and orders are all accurate and complete.   Signed, [name], MD  09/27/2021   [facility]

## 2021-09-30 NOTE — Addendum Note (Signed)
Addended by: Gerald Stabs on: 09/30/2021 07:56 AM   Modules accepted: Orders

## 2021-11-14 ENCOUNTER — Encounter (HOSPITAL_BASED_OUTPATIENT_CLINIC_OR_DEPARTMENT_OTHER): Payer: Self-pay

## 2021-11-14 NOTE — Telephone Encounter (Signed)
A Happy update for you!

## 2022-01-09 ENCOUNTER — Encounter (HOSPITAL_BASED_OUTPATIENT_CLINIC_OR_DEPARTMENT_OTHER): Payer: Self-pay

## 2022-07-07 ENCOUNTER — Encounter (HOSPITAL_BASED_OUTPATIENT_CLINIC_OR_DEPARTMENT_OTHER): Payer: Self-pay

## 2022-07-07 DIAGNOSIS — E7800 Pure hypercholesterolemia, unspecified: Secondary | ICD-10-CM

## 2022-07-07 MED ORDER — PRAVASTATIN SODIUM 20 MG PO TABS: 20.0000 mg | ORAL_TABLET | Freq: Every evening | ORAL | 3 refills | Status: AC

## 2022-07-23 ENCOUNTER — Other Ambulatory Visit: Payer: Self-pay | Admitting: Advanced Practice Midwife

## 2022-07-23 DIAGNOSIS — Z1231 Encounter for screening mammogram for malignant neoplasm of breast: Secondary | ICD-10-CM

## 2022-10-08 ENCOUNTER — Ambulatory Visit
Admission: RE | Admit: 2022-10-08 | Discharge: 2022-10-08 | Disposition: A | Discharge status: Home / Self Care | Payer: PRIVATE HEALTH INSURANCE | Source: Ambulatory Visit | Attending: Family Medicine | Admitting: Family Medicine

## 2022-10-08 ENCOUNTER — Other Ambulatory Visit: Payer: Self-pay | Admitting: Family Medicine

## 2022-10-08 DIAGNOSIS — R059 Cough, unspecified: Secondary | ICD-10-CM

## 2022-10-08 DIAGNOSIS — M8589 Other specified disorders of bone density and structure, multiple sites: Secondary | ICD-10-CM

## 2022-10-13 ENCOUNTER — Other Ambulatory Visit: Payer: Self-pay | Admitting: Family Medicine

## 2022-10-13 DIAGNOSIS — J9859 Other diseases of mediastinum, not elsewhere classified: Secondary | ICD-10-CM

## 2022-10-14 ENCOUNTER — Ambulatory Visit
Admission: RE | Admit: 2022-10-14 | Discharge: 2022-10-14 | Disposition: A | Discharge status: Home / Self Care | Payer: Medicare Other | Source: Ambulatory Visit | Attending: Advanced Practice Midwife | Admitting: Advanced Practice Midwife

## 2022-10-14 DIAGNOSIS — Z1231 Encounter for screening mammogram for malignant neoplasm of breast: Secondary | ICD-10-CM

## 2022-10-22 ENCOUNTER — Other Ambulatory Visit: Payer: Medicare Other

## 2022-11-07 ENCOUNTER — Ambulatory Visit
Admission: RE | Admit: 2022-11-07 | Discharge: 2022-11-07 | Disposition: A | Discharge status: Home / Self Care | Payer: Medicare Other | Source: Ambulatory Visit | Attending: Family Medicine | Admitting: Family Medicine

## 2022-11-07 DIAGNOSIS — J9859 Other diseases of mediastinum, not elsewhere classified: Secondary | ICD-10-CM

## 2022-11-07 MED ORDER — IOPAMIDOL (ISOVUE-370) INJECTION 76%
75.0000 mL | Freq: Once | INTRAVENOUS | Status: AC | PRN
  Administered 2022-11-07: 60 mL via INTRAVENOUS

## 2023-04-27 ENCOUNTER — Ambulatory Visit
Admission: RE | Admit: 2023-04-27 | Discharge: 2023-04-27 | Disposition: A | Discharge status: Home / Self Care | Payer: Medicare Other | Source: Ambulatory Visit | Attending: Family Medicine | Admitting: Family Medicine

## 2023-04-27 DIAGNOSIS — M8589 Other specified disorders of bone density and structure, multiple sites: Secondary | ICD-10-CM

## 2023-05-19 ENCOUNTER — Telehealth (HOSPITAL_BASED_OUTPATIENT_CLINIC_OR_DEPARTMENT_OTHER): Payer: Self-pay | Admitting: Cardiology

## 2023-05-19 ENCOUNTER — Encounter (HOSPITAL_BASED_OUTPATIENT_CLINIC_OR_DEPARTMENT_OTHER): Payer: Self-pay

## 2023-05-19 NOTE — Telephone Encounter (Signed)
Left message for patient regarding new appointment date with Dr. [place] Callas 07/10/23 at 8:20 am---new 07/17/23 at 8:20 am.  Requested return confirmation call  from patient

## 2023-07-10 ENCOUNTER — Ambulatory Visit (HOSPITAL_BASED_OUTPATIENT_CLINIC_OR_DEPARTMENT_OTHER): Payer: Medicare Other | Admitting: Cardiology

## 2023-07-17 ENCOUNTER — Encounter (HOSPITAL_BASED_OUTPATIENT_CLINIC_OR_DEPARTMENT_OTHER): Payer: Self-pay | Admitting: Cardiology

## 2023-07-17 ENCOUNTER — Ambulatory Visit (INDEPENDENT_AMBULATORY_CARE_PROVIDER_SITE_OTHER): Payer: Medicare Other | Admitting: Cardiology

## 2023-07-17 VITALS — BP 124/84 | HR 60 | Ht 61.0 in | Wt 176.7 lb

## 2023-07-17 DIAGNOSIS — R002 Palpitations: Secondary | ICD-10-CM

## 2023-07-17 DIAGNOSIS — E7800 Pure hypercholesterolemia, unspecified: Secondary | ICD-10-CM

## 2023-07-17 DIAGNOSIS — Z7189 Other specified counseling: Secondary | ICD-10-CM

## 2023-07-17 NOTE — Patient Instructions (Signed)
Medication Instructions:  Your physician recommends that you continue on your current medications as directed. Please refer to the Current Medication list given to you today.  *If you need a refill on your cardiac medications before your next appointment, please call your pharmacy*  Follow-Up: At [facility], you and your health needs are our priority.  As part of our continuing mission to provide you with exceptional heart care, we have created designated Provider Care Teams.  These Care Teams include your primary Cardiologist (physician) and Advanced Practice Providers (APPs -  Physician Assistants and Nurse Practitioners) who all work together to provide you with the care you need, when you need it.  We recommend signing up for the patient portal called "MyChart".  Sign up information is provided on this After Visit Summary.  MyChart is used to connect with patients for Virtual Visits (Telemedicine).  Patients are able to view lab/test results, encounter notes, upcoming appointments, etc.  Non-urgent messages can be sent to your provider as well.   To learn more about what you can do with MyChart, go to [email or web address].    Your next appointment:   2 year(s)  Provider:   Jodelle Red, MD

## 2023-07-17 NOTE — Progress Notes (Signed)
Cardiology Office Note:  .    Date:  07/17/2023  ID:  ALTON FALLETTA, DOB [birth year], MRN [id number] PCP: Lupita Raider, MD  [facility] Providers Cardiologist:  Jodelle Red, MD     History of Present Illness: .    Julie Cross is a 67 y.o. female with hypertension, hyperlipidemia, hypothyroidism, and GERD here for follow-up. She has been seen previously for atypical chest pain. She was previously followed by Dr. Okey Dupre.   At her visit 09/2021, her main complaint was bothersome myalgias. Previously had severe right knee pain when she first began statin medication, but the knee pain resolved. For a few months she had developed lower back and neck pains with severity of 4-5/10. Also noted rare heart pounding in her ears. After shared decision making, we switched from rosuvastatin to pravastatin 20 mg daily. Subsequently her myalgias subsided and she was tolerating pravastatin well. However, she continued to experience muscle fatigue and asked about CoQ10. For a month she stopped the pravastatin and added 100 mg CoQ10 daily as advised. She then resumed her pravastatin.  Today, she states she is feeling well overall. Her main complaint at this time is some residual issues with low stamina since an illness last year. At this time, all of her previous complaints of myalgias have resolved. She continues to take her pravastatin 20 mg and CoQ10.  Of note, she is now retired and under less stress. Her blood pressure is well controlled today at 124/84. For activity, she walks at the park for 2 miles, usually three days a week. Performs light exercises at home. When the weather worsens she plans to use the treadmill and strength training at the [facility].   She denies any palpitations, chest pain, shortness of breath, peripheral edema, lightheadedness, headaches, syncope, orthopnea, or PND.  ROS:  Please see the history of present illness. ROS otherwise negative except as noted.  (+) Low  stamina, fatigue  Studies Reviewed: Marland Kitchen    EKG Interpretation Date/Time:  Friday July 17 2023 08:12:47 EDT Ventricular Rate:  60 PR Interval:  176 QRS Duration:  72 QT Interval:  426 QTC Calculation: 426 R Axis:   0  Text Interpretation: Normal sinus rhythm Cannot rule out Inferior infarct , age undetermined Cannot rule out Anterior infarct , age undetermined Confirmed by Jodelle Red [phone]) on 07/17/2023 8:27:18 AM    Physical Exam:    VS:  BP 124/84   Pulse 60   Ht 5\' 1"  (1.549 m)   Wt 176 lb 11.2 oz (80.2 kg)   SpO2 98%   BMI 33.39 kg/m    Wt Readings from Last 3 Encounters:  07/17/23 176 lb 11.2 oz (80.2 kg)  09/27/21 180 lb 14.4 oz (82.1 kg)  07/26/20 167 lb 3.2 oz (75.8 kg)    GEN: Well nourished, well developed in no acute distress HEENT: Normal, moist mucous membranes NECK: No JVD CARDIAC: regular rhythm, normal S1 and S2, no rubs or gallops. No murmur. VASCULAR: Radial and DP pulses 2+ bilaterally. No carotid bruits RESPIRATORY:  Clear to auscultation without rales, wheezing or rhonchi  ABDOMEN: Soft, non-tender, non-distended MUSCULOSKELETAL:  Ambulates independently SKIN: Warm and dry, no edema NEUROLOGIC:  Alert and oriented x 3. No focal neuro deficits noted. PSYCHIATRIC:  Normal affect   ASSESSMENT AND PLAN: .    Palpitations: -has been manageable, continue to monitor -counseled on red flag warning signs that need immediate medical attention   Hyperlipidemia: -continue pravastatin   Hypertension: -at goal  today, on no medications currently, continue to monitor   CV risk counseling and prevention: -recommend heart healthy/Mediterranean diet, with whole grains, fruits, vegetable, fish, lean meats, nuts, and olive oil. Limit salt. -recommend moderate walking, 3-5 times/week for 30-50 minutes each session. Aim for at least 150 minutes.week. Goal should be pace of 3 miles/hours, or walking 1.5 miles in 30 minutes -recommend avoidance of  tobacco products. Avoid excess alcohol.  Dispo: Follow-up in 2 years, or sooner as needed.  I,Mathew Stumpf,acting as a Neurosurgeon for [employer], MD.,have documented all relevant documentation on the behalf of Jodelle Red, MD,as directed by  Jodelle Red, MD while in the presence of Jodelle Red, MD.  I, Jodelle Red, MD, have reviewed all documentation for this visit. The documentation on 07/17/23 for the exam, diagnosis, procedures, and orders are all accurate and complete.   Signed, Jodelle Red, MD

## 2023-10-13 ENCOUNTER — Other Ambulatory Visit: Payer: Self-pay | Admitting: Family Medicine

## 2023-10-13 DIAGNOSIS — Z1231 Encounter for screening mammogram for malignant neoplasm of breast: Secondary | ICD-10-CM

## 2023-10-20 ENCOUNTER — Ambulatory Visit
Admission: RE | Admit: 2023-10-20 | Discharge: 2023-10-20 | Disposition: A | Discharge status: Home / Self Care | Payer: Medicare Other | Source: Ambulatory Visit | Attending: Family Medicine | Admitting: Family Medicine

## 2023-10-20 DIAGNOSIS — Z1231 Encounter for screening mammogram for malignant neoplasm of breast: Secondary | ICD-10-CM

## 2024-06-29 ENCOUNTER — Other Ambulatory Visit: Payer: Self-pay | Admitting: Family Medicine

## 2024-06-29 DIAGNOSIS — R109 Unspecified abdominal pain: Secondary | ICD-10-CM

## 2024-07-01 ENCOUNTER — Ambulatory Visit
Admission: RE | Admit: 2024-07-01 | Discharge: 2024-07-01 | Disposition: A | Discharge status: Home / Self Care | Source: Ambulatory Visit | Attending: Family Medicine | Admitting: Family Medicine

## 2024-07-01 DIAGNOSIS — R109 Unspecified abdominal pain: Secondary | ICD-10-CM

## 2024-12-08 ENCOUNTER — Other Ambulatory Visit: Payer: Self-pay | Admitting: Family Medicine

## 2024-12-08 DIAGNOSIS — Z1231 Encounter for screening mammogram for malignant neoplasm of breast: Secondary | ICD-10-CM

## 2024-12-16 ENCOUNTER — Ambulatory Visit

## 2024-12-16 DIAGNOSIS — Z1231 Encounter for screening mammogram for malignant neoplasm of breast: Secondary | ICD-10-CM

## 2024-12-29 ENCOUNTER — Ambulatory Visit
